# Patient Record
Sex: Female | Born: 1990
Health system: Southern US, Community
[De-identification: ages and names within clinical notes are randomized; demographics above are authoritative.]

## PROBLEM LIST (undated history)

## (undated) DIAGNOSIS — N809 Endometriosis, unspecified: Secondary | ICD-10-CM

## (undated) DIAGNOSIS — G43909 Migraine, unspecified, not intractable, without status migrainosus: Secondary | ICD-10-CM

---

## 2017-06-26 ENCOUNTER — Other Ambulatory Visit: Payer: Self-pay

## 2017-06-26 ENCOUNTER — Encounter (HOSPITAL_COMMUNITY): Payer: Self-pay | Admitting: Emergency Medicine

## 2017-06-26 ENCOUNTER — Emergency Department (HOSPITAL_COMMUNITY)
Admission: EM | Admit: 2017-06-26 | Discharge: 2017-06-26 | Disposition: A | Discharge status: Home / Self Care | Payer: Self-pay | Attending: Emergency Medicine | Admitting: Emergency Medicine

## 2017-06-26 DIAGNOSIS — K0889 Other specified disorders of teeth and supporting structures: Secondary | ICD-10-CM | POA: Insufficient documentation

## 2017-06-26 HISTORY — DX: Endometriosis, unspecified: N80.9

## 2017-06-26 MED ORDER — PENICILLIN V POTASSIUM 500 MG PO TABS
500.0000 mg | ORAL_TABLET | Freq: Once | ORAL | Status: AC
  Administered 2017-06-26: 500 mg via ORAL
  Filled 2017-06-26: qty 1

## 2017-06-26 MED ORDER — IBUPROFEN 200 MG PO TABS
600.0000 mg | ORAL_TABLET | Freq: Once | ORAL | Status: AC
  Administered 2017-06-26: 600 mg via ORAL
  Filled 2017-06-26: qty 3

## 2017-06-26 MED ORDER — HYDROCODONE-ACETAMINOPHEN 5-325 MG PO TABS: 1.0000 | ORAL_TABLET | Freq: Four times a day (QID) | ORAL | 0 refills | Status: DC | PRN

## 2017-06-26 MED ORDER — PENICILLIN V POTASSIUM 500 MG PO TABS: 500.0000 mg | ORAL_TABLET | Freq: Four times a day (QID) | ORAL | 0 refills | Status: AC

## 2017-06-26 MED ORDER — IBUPROFEN 600 MG PO TABS: 600.0000 mg | ORAL_TABLET | Freq: Four times a day (QID) | ORAL | 0 refills | Status: DC | PRN

## 2017-06-26 NOTE — ED Triage Notes (Signed)
Pt is c/o dental pain on the right side on top   Pt states she has Anbesol and OTC medication without relief  Pt states she has a wisdom tooth coming in and a chipped tooth on that side

## 2017-06-26 NOTE — ED Provider Notes (Signed)
[facility]-EMERGENCY DEPT Provider Note   CSN: [id number] Arrival date & time: 06/26/17  0458     History   Chief Complaint Chief Complaint  Patient presents with  . Dental Pain    HPI Alejandra Clark is a 26 y.o. female.  26 year old female presents to the emergency department for dental pain.  She reports intermittent dental pain since September which became more constant 1.5 weeks ago.  She is no longer having relief with over-the-counter Motrin.  Patient describes the pain as aching.  No modifying factors of pain.  She has not been able to follow-up with a dentist secondary to lack of insurance coverage.  She denies any inability to open her jaw.  No fevers or history of trauma.      Past Medical History:  Diagnosis Date  . Endometriosis     There are no active problems to display for this patient.   History reviewed. No pertinent surgical history.  OB History    No data available       Home Medications    Prior to Admission medications   Medication Sig Start Date End Date Taking? Authorizing Provider  HYDROcodone-acetaminophen (NORCO/VICODIN) 5-325 MG tablet Take 1 tablet every 6 (six) hours as needed by mouth for severe pain. 06/26/17   Antony MaduraHumes, Shayanna Thatch, PA-C  ibuprofen (ADVIL,MOTRIN) 600 MG tablet Take 1 tablet (600 mg total) every 6 (six) hours as needed by mouth. 06/26/17   Antony MaduraHumes, Shantil Vallejo, PA-C  penicillin v potassium (VEETID) 500 MG tablet Take 1 tablet (500 mg total) 4 (four) times daily for 7 days by mouth. 06/26/17 07/03/17  Antony MaduraHumes, Shephanie Romas, PA-C    Family History Family History  Problem Relation Age of Onset  . Hypertension Other   . Diabetes Other   . Kidney failure Other     Social History Social History   Tobacco Use  . Smoking status: Never Smoker  . Smokeless tobacco: Never Used  Substance Use Topics  . Alcohol use: Yes    Comment: social  . Drug use: No     Allergies   Tramadol   Review of Systems Review of  Systems Ten systems reviewed and are negative for acute change, except as noted in the HPI.    Physical Exam Updated Vital Signs BP (!) 127/91 (BP Location: Right Arm)   Pulse 64   Temp 98 F (36.7 C) (Oral)   Resp 16   Ht 5\' 3"  (1.6 m)   Wt 102.5 kg (226 lb)   LMP 06/23/2017 (Exact Date)   SpO2 100%   BMI 40.03 kg/m   Physical Exam  Constitutional: She is oriented to person, place, and time. She appears well-developed and well-nourished. No distress.  Nontoxic appearing and in NAD  HENT:  Head: Normocephalic and atraumatic.  Mouth/Throat: Oropharynx is clear and moist and mucous membranes are normal. No oral lesions. No trismus in the jaw. Abnormal dentition. Dental caries present. No dental abscesses.    No trismus.  Patient tolerating secretions without difficulty.  Eyes: Conjunctivae and EOM are normal. No scleral icterus.  Neck: Normal range of motion.  No meningismus  Pulmonary/Chest: Effort normal. No respiratory distress.  Respirations even and unlabored  Musculoskeletal: Normal range of motion.  Neurological: She is alert and oriented to person, place, and time.  Skin: Skin is warm and dry. No rash noted. She is not diaphoretic. No erythema. No pallor.  Psychiatric: She has a normal mood and affect. Her behavior is normal.  Nursing  note and vitals reviewed.    ED Treatments / Results  Labs (all labs ordered are listed, but only abnormal results are displayed) Labs Reviewed - No data to display  EKG  EKG Interpretation None       Radiology No results found.  Procedures Procedures (including critical care time)  Medications Ordered in ED Medications  ibuprofen (ADVIL,MOTRIN) tablet 600 mg (not administered)  penicillin v potassium (VEETID) tablet 500 mg (not administered)     Initial Impression / Assessment and Plan / ED Course  I have reviewed the triage vital signs and the nursing notes.  Pertinent labs & imaging results that were available  during my care of the patient were reviewed by me and considered in my medical decision making (see chart for details).     Patient with toothache.  No gross abscess.  Exam unconcerning for Ludwig's angina or spread of infection.  Will treat with penicillin and pain medicine.  Urged patient to follow-up with dentist.  Referral and return precautions provided. Patient discharged in stable condition with no unaddressed concerns.   Final Clinical Impressions(s) / ED Diagnoses   Final diagnoses:  Dentalgia    ED Discharge Orders        Ordered    penicillin v potassium (VEETID) 500 MG tablet  4 times daily     06/26/17 0536    HYDROcodone-acetaminophen (NORCO/VICODIN) 5-325 MG tablet  Every 6 hours PRN     06/26/17 0536    ibuprofen (ADVIL,MOTRIN) 600 MG tablet  Every 6 hours PRN     06/26/17 0536       Antony MaduraHumes, Jael Kostick, PA-C 06/26/17 0542    Palumbo, April, MD 06/26/17 [phone]

## 2017-06-26 NOTE — Discharge Instructions (Signed)
Follow-up with a dentist as soon as you are able.  You will likely also require follow-up with an oral surgeon.  Take penicillin as prescribed until finished.

## 2017-06-27 ENCOUNTER — Emergency Department (HOSPITAL_BASED_OUTPATIENT_CLINIC_OR_DEPARTMENT_OTHER)
Admission: EM | Admit: 2017-06-27 | Discharge: 2017-06-27 | Disposition: A | Discharge status: Home / Self Care | Payer: Self-pay | Attending: Emergency Medicine | Admitting: Emergency Medicine

## 2017-06-27 ENCOUNTER — Encounter (HOSPITAL_BASED_OUTPATIENT_CLINIC_OR_DEPARTMENT_OTHER): Payer: Self-pay | Admitting: Emergency Medicine

## 2017-06-27 ENCOUNTER — Other Ambulatory Visit: Payer: Self-pay

## 2017-06-27 DIAGNOSIS — N390 Urinary tract infection, site not specified: Secondary | ICD-10-CM | POA: Insufficient documentation

## 2017-06-27 DIAGNOSIS — R102 Pelvic and perineal pain: Secondary | ICD-10-CM | POA: Insufficient documentation

## 2017-06-27 DIAGNOSIS — R110 Nausea: Secondary | ICD-10-CM | POA: Insufficient documentation

## 2017-06-27 DIAGNOSIS — M545 Low back pain: Secondary | ICD-10-CM | POA: Insufficient documentation

## 2017-06-27 LAB — WET PREP, GENITAL
Sperm: NONE SEEN
TRICH WET PREP: NONE SEEN
YEAST WET PREP: NONE SEEN

## 2017-06-27 LAB — URINALYSIS, ROUTINE W REFLEX MICROSCOPIC
Bilirubin Urine: NEGATIVE
GLUCOSE, UA: NEGATIVE mg/dL
Ketones, ur: NEGATIVE mg/dL
Nitrite: NEGATIVE
Protein, ur: NEGATIVE mg/dL
SPECIFIC GRAVITY, URINE: 1.02 (ref 1.005–1.030)
pH: 6 (ref 5.0–8.0)

## 2017-06-27 LAB — URINALYSIS, MICROSCOPIC (REFLEX)

## 2017-06-27 LAB — PREGNANCY, URINE: Preg Test, Ur: NEGATIVE

## 2017-06-27 MED ORDER — CEPHALEXIN 250 MG PO CAPS
1000.0000 mg | ORAL_CAPSULE | Freq: Once | ORAL | Status: AC
  Administered 2017-06-27: 1000 mg via ORAL
  Filled 2017-06-27: qty 4

## 2017-06-27 MED ORDER — IBUPROFEN 800 MG PO TABS
800.0000 mg | ORAL_TABLET | Freq: Once | ORAL | Status: AC
  Administered 2017-06-27: 800 mg via ORAL
  Filled 2017-06-27: qty 1

## 2017-06-27 MED ORDER — IBUPROFEN 800 MG PO TABS: 800.0000 mg | ORAL_TABLET | Freq: Three times a day (TID) | ORAL | 0 refills | Status: DC

## 2017-06-27 MED ORDER — CEPHALEXIN 500 MG PO CAPS: 1000.0000 mg | ORAL_CAPSULE | Freq: Two times a day (BID) | ORAL | 0 refills | Status: DC

## 2017-06-27 MED ORDER — ACETAMINOPHEN 500 MG PO TABS: 1000.0000 mg | ORAL_TABLET | Freq: Four times a day (QID) | ORAL | 0 refills | Status: AC | PRN

## 2017-06-27 MED ORDER — ACETAMINOPHEN 500 MG PO TABS
1000.0000 mg | ORAL_TABLET | Freq: Once | ORAL | Status: AC
  Administered 2017-06-27: 1000 mg via ORAL
  Filled 2017-06-27: qty 2

## 2017-06-27 NOTE — ED Notes (Signed)
Pt given d/c instructions as per chart. Rx x 3. Verbalizes understanding. No questions. 

## 2017-06-27 NOTE — ED Notes (Signed)
Pt agitated that she has not received any pain medicine and that "no one has been in here to see me."

## 2017-06-27 NOTE — ED Triage Notes (Signed)
Pt c/o generalized abd pain, nausea x 2wks

## 2017-06-27 NOTE — ED Provider Notes (Signed)
[facility] EMERGENCY DEPARTMENT Provider Note   CSN: [id number] Arrival date & time: 06/27/17  1603     History   Chief Complaint Chief Complaint  Patient presents with  . Abdominal Pain    HPI Alejandra Clark is a 26 y.o. female.  HPI Patient reports she is has a history of endometriosis.  She reports that she gets pelvic pain and irregular bleeding.  However for the past 2 weeks she has been having intermittent spotting and bleeding.  Also the cramping has been ongoing with pain in her lower back.  She reports is atypical for the symptoms to go on for this duration.  Ports she is sexually active.  It does not suspect sexually transmitted disease.  No fever or vomiting.  Some nausea. Past Medical History:  Diagnosis Date  . Endometriosis     There are no active problems to display for this patient.   History reviewed. No pertinent surgical history.  OB History    No data available       Home Medications    Prior to Admission medications   Medication Sig Start Date End Date Taking? Authorizing Provider  acetaminophen (TYLENOL) 500 MG tablet Take 2 tablets (1,000 mg total) every 6 (six) hours as needed by mouth. 06/27/17   Arby BarrettePfeiffer, Preethi Scantlebury, MD  cephALEXin (KEFLEX) 500 MG capsule Take 2 capsules (1,000 mg total) 2 (two) times daily by mouth. 06/27/17   Arby BarrettePfeiffer, Mahi Zabriskie, MD  HYDROcodone-acetaminophen (NORCO/VICODIN) 5-325 MG tablet Take 1 tablet every 6 (six) hours as needed by mouth for severe pain. 06/26/17   Antony MaduraHumes, Kelly, PA-C  ibuprofen (ADVIL,MOTRIN) 600 MG tablet Take 1 tablet (600 mg total) every 6 (six) hours as needed by mouth. 06/26/17   Antony MaduraHumes, Kelly, PA-C  ibuprofen (ADVIL,MOTRIN) 800 MG tablet Take 1 tablet (800 mg total) 3 (three) times daily by mouth. 06/27/17   Arby BarrettePfeiffer, Aveleen Nevers, MD  penicillin v potassium (VEETID) 500 MG tablet Take 1 tablet (500 mg total) 4 (four) times daily for 7 days by mouth. 06/26/17 07/03/17  Antony MaduraHumes, Kelly, PA-C    Family  History Family History  Problem Relation Age of Onset  . Hypertension Other   . Diabetes Other   . Kidney failure Other     Social History Social History   Tobacco Use  . Smoking status: Never Smoker  . Smokeless tobacco: Never Used  Substance Use Topics  . Alcohol use: Yes    Comment: social  . Drug use: No     Allergies   Tramadol   Review of Systems Review of Systems 10 Systems reviewed and are negative for acute change except as noted in the HPI.   Physical Exam Updated Vital Signs BP (!) 137/91 (BP Location: Right Arm)   Pulse 86   Temp 98.3 F (36.8 C) (Oral)   Resp 16   LMP 06/23/2017 (Exact Date)   SpO2 99%   Physical Exam  Constitutional: She is oriented to person, place, and time. She appears well-developed and well-nourished. No distress.  HENT:  Head: Normocephalic and atraumatic.  Eyes: Conjunctivae are normal.  Neck: Neck supple.  Cardiovascular: Normal rate and regular rhythm.  No murmur heard. Pulmonary/Chest: Effort normal and breath sounds normal. No respiratory distress.  Abdominal: Soft. She exhibits no distension. There is tenderness.  Patient endorses diffuse lower abdominal pain to palpation.  No guarding.  Genitourinary:  Genitourinary Comments: Normal external female genitalia.  Speculum examination small to moderate volume of menstrual blood in the vault.  No clot.  Cervix is closed without clots or discharge.  Patient endorses generalized tenderness to palpation.  She has tenderness over the cervix uterus and left adnexa.  She reports that this is not an atypical amount of pain on a pelvic exam for her.  Musculoskeletal: She exhibits no edema.  Neurological: She is alert and oriented to person, place, and time. No cranial nerve deficit. She exhibits normal muscle tone. Coordination normal.  Skin: Skin is warm and dry.  Psychiatric: She has a normal mood and affect.  Nursing note and vitals reviewed.    ED Treatments / Results    Labs (all labs ordered are listed, but only abnormal results are displayed) Labs Reviewed  WET PREP, GENITAL - Abnormal; Notable for the following components:      Result Value   Clue Cells Wet Prep HPF POC PRESENT (*)    WBC, Wet Prep HPF POC MANY (*)    All other components within normal limits  URINALYSIS, ROUTINE W REFLEX MICROSCOPIC - Abnormal; Notable for the following components:   APPearance CLOUDY (*)    Hgb urine dipstick LARGE (*)    Leukocytes, UA LARGE (*)    All other components within normal limits  URINALYSIS, MICROSCOPIC (REFLEX) - Abnormal; Notable for the following components:   Bacteria, UA MANY (*)    Squamous Epithelial / LPF 6-30 (*)    All other components within normal limits  URINE CULTURE  PREGNANCY, URINE  GC/CHLAMYDIA PROBE AMP [facility]    EKG  EKG Interpretation None       Radiology No results found.  Procedures Procedures (including critical care time)  Medications Ordered in ED Medications  ibuprofen (ADVIL,MOTRIN) tablet 800 mg (800 mg Oral Refused 06/27/17 2100)  acetaminophen (TYLENOL) tablet 1,000 mg (1,000 mg Oral Refused 06/27/17 2101)  cephALEXin (KEFLEX) capsule 1,000 mg (1,000 mg Oral Given 06/27/17 2100)     Initial Impression / Assessment and Plan / ED Course  I have reviewed the triage vital signs and the nursing notes.  Pertinent labs & imaging results that were available during my care of the patient were reviewed by me and considered in my medical decision making (see chart for details).     Final Clinical Impressions(s) / ED Diagnoses   Final diagnoses:  Nausea  Pelvic pain in female  Lower urinary tract infectious disease   Patient's urinalysis was grossly positive for UTI.  With patient having increasing pelvic and low back pain I will opt to treat with Keflex.  She reports endometriosis and irregular menstrual bleeding at baseline.  At this time we will not opt for empiric treatment of STD.   Cultures will be pending.  Patient counseled to use ibuprofen and Tylenol for pain.  She was given a Vicodin prescription for dental pain in the past day.  She may use this but not Tylenol together as advised. ED Discharge Orders        Ordered    cephALEXin (KEFLEX) 500 MG capsule  2 times daily     06/27/17 2123    ibuprofen (ADVIL,MOTRIN) 800 MG tablet  3 times daily     06/27/17 2123    acetaminophen (TYLENOL) 500 MG tablet  Every 6 hours PRN     06/27/17 2123       Arby BarrettePfeiffer, Natale Barba, MD 06/27/17 2133

## 2017-06-27 NOTE — ED Notes (Signed)
Pelvic Cart set up in room, Specimens not collected yet.

## 2017-06-29 LAB — URINE CULTURE

## 2017-06-29 LAB — GC/CHLAMYDIA PROBE AMP (~~LOC~~) NOT AT ARMC
CHLAMYDIA, DNA PROBE: NEGATIVE
NEISSERIA GONORRHEA: NEGATIVE

## 2017-08-27 ENCOUNTER — Other Ambulatory Visit: Payer: Self-pay

## 2017-08-27 ENCOUNTER — Emergency Department (HOSPITAL_BASED_OUTPATIENT_CLINIC_OR_DEPARTMENT_OTHER)
Admission: EM | Admit: 2017-08-27 | Discharge: 2017-08-27 | Disposition: A | Discharge status: Home / Self Care | Payer: Medicaid Other | Attending: Emergency Medicine | Admitting: Emergency Medicine

## 2017-08-27 ENCOUNTER — Emergency Department (HOSPITAL_BASED_OUTPATIENT_CLINIC_OR_DEPARTMENT_OTHER): Payer: Medicaid Other

## 2017-08-27 ENCOUNTER — Encounter (HOSPITAL_BASED_OUTPATIENT_CLINIC_OR_DEPARTMENT_OTHER): Payer: Self-pay | Admitting: *Deleted

## 2017-08-27 DIAGNOSIS — B9689 Other specified bacterial agents as the cause of diseases classified elsewhere: Secondary | ICD-10-CM | POA: Diagnosis not present

## 2017-08-27 DIAGNOSIS — N760 Acute vaginitis: Secondary | ICD-10-CM | POA: Diagnosis not present

## 2017-08-27 DIAGNOSIS — Z79899 Other long term (current) drug therapy: Secondary | ICD-10-CM | POA: Insufficient documentation

## 2017-08-27 DIAGNOSIS — R102 Pelvic and perineal pain: Secondary | ICD-10-CM

## 2017-08-27 DIAGNOSIS — D649 Anemia, unspecified: Secondary | ICD-10-CM

## 2017-08-27 LAB — URINALYSIS, ROUTINE W REFLEX MICROSCOPIC
BILIRUBIN URINE: NEGATIVE
GLUCOSE, UA: NEGATIVE mg/dL
Hgb urine dipstick: NEGATIVE
KETONES UR: NEGATIVE mg/dL
NITRITE: NEGATIVE
PROTEIN: NEGATIVE mg/dL
SPECIFIC GRAVITY, URINE: 1.025 (ref 1.005–1.030)
pH: 6 (ref 5.0–8.0)

## 2017-08-27 LAB — COMPREHENSIVE METABOLIC PANEL
ALBUMIN: 3.9 g/dL (ref 3.5–5.0)
ALT: 17 U/L (ref 14–54)
ANION GAP: 9 (ref 5–15)
AST: 18 U/L (ref 15–41)
Alkaline Phosphatase: 50 U/L (ref 38–126)
BUN: 8 mg/dL (ref 6–20)
CO2: 22 mmol/L (ref 22–32)
Calcium: 9 mg/dL (ref 8.9–10.3)
Chloride: 103 mmol/L (ref 101–111)
Creatinine, Ser: 0.78 mg/dL (ref 0.44–1.00)
GFR calc non Af Amer: >60 mL/min (ref >60)
GLUCOSE: 92 mg/dL (ref 65–99)
POTASSIUM: 4.2 mmol/L (ref 3.5–5.1)
SODIUM: 134 mmol/L — AB (ref 135–145)
Total Bilirubin: 0.3 mg/dL (ref 0.3–1.2)
Total Protein: 8.2 g/dL — ABNORMAL HIGH (ref 6.5–8.1)

## 2017-08-27 LAB — CBC WITH DIFFERENTIAL/PLATELET
BASOS PCT: 0 %
Basophils Absolute: 0 10*3/uL (ref 0.0–0.1)
Eosinophils Absolute: 0.1 10*3/uL (ref 0.0–0.7)
Eosinophils Relative: 1 %
HEMATOCRIT: 33.9 % — AB (ref 36.0–46.0)
HEMOGLOBIN: 10.7 g/dL — AB (ref 12.0–15.0)
LYMPHS ABS: 2.5 10*3/uL (ref 0.7–4.0)
Lymphocytes Relative: 27 %
MCH: 23.8 pg — AB (ref 26.0–34.0)
MCHC: 31.6 g/dL (ref 30.0–36.0)
MCV: 75.3 fL — AB (ref 78.0–100.0)
MONOS PCT: 10 %
Monocytes Absolute: 0.9 10*3/uL (ref 0.1–1.0)
NEUTROS ABS: 5.5 10*3/uL (ref 1.7–7.7)
NEUTROS PCT: 62 %
Platelets: 454 10*3/uL — ABNORMAL HIGH (ref 150–400)
RBC: 4.5 MIL/uL (ref 3.87–5.11)
RDW: 17.7 % — ABNORMAL HIGH (ref 11.5–15.5)
WBC: 9 10*3/uL (ref 4.0–10.5)

## 2017-08-27 LAB — URINALYSIS, MICROSCOPIC (REFLEX)

## 2017-08-27 LAB — WET PREP, GENITAL
SPERM: NONE SEEN
Trich, Wet Prep: NONE SEEN
Yeast Wet Prep HPF POC: NONE SEEN

## 2017-08-27 LAB — PREGNANCY, URINE: Preg Test, Ur: NEGATIVE

## 2017-08-27 MED ORDER — NAPROXEN 500 MG PO TABS: 500.0000 mg | ORAL_TABLET | Freq: Two times a day (BID) | ORAL | 0 refills | Status: DC

## 2017-08-27 MED ORDER — KETOROLAC TROMETHAMINE 15 MG/ML IJ SOLN
15.0000 mg | Freq: Once | INTRAMUSCULAR | Status: AC
  Administered 2017-08-27: 15 mg via INTRAVENOUS
  Filled 2017-08-27: qty 1

## 2017-08-27 MED ORDER — ACETAMINOPHEN 500 MG PO TABS
1000.0000 mg | ORAL_TABLET | Freq: Once | ORAL | Status: AC
  Administered 2017-08-27: 1000 mg via ORAL
  Filled 2017-08-27: qty 2

## 2017-08-27 MED ORDER — TRAMADOL HCL 50 MG PO TABS
50.0000 mg | ORAL_TABLET | Freq: Once | ORAL | Status: AC
  Administered 2017-08-27: 50 mg via ORAL
  Filled 2017-08-27 (×2): qty 1

## 2017-08-27 MED ORDER — FERROUS SULFATE 325 (65 FE) MG PO TABS: 325.0000 mg | ORAL_TABLET | Freq: Every day | ORAL | 0 refills | Status: AC

## 2017-08-27 MED ORDER — ONDANSETRON HCL 4 MG/2ML IJ SOLN
4.0000 mg | Freq: Once | INTRAMUSCULAR | Status: AC
  Administered 2017-08-27: 4 mg via INTRAVENOUS
  Filled 2017-08-27: qty 2

## 2017-08-27 MED ORDER — SODIUM CHLORIDE 0.9 % IV BOLUS (SEPSIS)
1000.0000 mL | Freq: Once | INTRAVENOUS | Status: AC
  Administered 2017-08-27: 1000 mL via INTRAVENOUS

## 2017-08-27 MED ORDER — METRONIDAZOLE 500 MG PO TABS: 500.0000 mg | ORAL_TABLET | Freq: Two times a day (BID) | ORAL | 0 refills | Status: DC

## 2017-08-27 MED ORDER — TRAMADOL HCL 50 MG PO TABS: 50.0000 mg | ORAL_TABLET | Freq: Four times a day (QID) | ORAL | 0 refills | Status: DC | PRN

## 2017-08-27 MED FILL — metroNIDAZOLE 500 MG TABS: 500 | 7 days supply | Qty: 14 | Fill #0

## 2017-08-27 MED FILL — NAPROXEN 500 MG TABLET: 500 | 15 days supply | Qty: 30 | Fill #0

## 2017-08-27 MED FILL — FERROUS SULFATE 325 MG TAB: 325 (65 FE) | 100 days supply | Qty: 100 | Fill #0

## 2017-08-27 MED FILL — traMADol HCL 50 MG TABS: 50 | 3 days supply | Qty: 15 | Fill #0

## 2017-08-27 NOTE — ED Triage Notes (Signed)
Pt reports her usual endometriosis pelvic pain x Sunday, worsening over the last 2 days. Denies any vag bleeding or d/c, denies n/v/d or fevers.

## 2017-08-27 NOTE — ED Provider Notes (Signed)
[facility] EMERGENCY DEPARTMENT Provider Note   CSN: [id number] Arrival date & time: 08/27/17  1055     History   Chief Complaint Chief Complaint  Patient presents with  . Pelvic Pain    HPI Alejandra Clark is a 27 y.o. female with a hx of endometriosis and anemia who presents to the ED complaining of pelvic pain for the past 5 days. Patient states she is having diffuse pressure pelvic pain with intermittent sharp pain to the left side of the pelvis.  Rates the pain a 7/10 in severity, has tried tylenol, ibuprofen, and naproxen all without relief- has not had any medicines today. Pain is somewhat better with heating pad. Pain does not have any specific worsening factors. States pain is similar to previous pain with endometriosis, however typically sharp pains are on the R. LMP ended 01/04, unsure of exact start date- states did have larger blood clots with this previous menses, states she typically has heavy periods. Reports baseline unchanged vaginal discharge. Denies vaginal bleeding, dysuria, urgency, frequency, vomiting, blood in stool, fever, or chills. Currently looking for new Ob/gyn in the area. Currently sexually active, no protection, states she is having dyspareunia which is baseline for her.   HPI  Past Medical History:  Diagnosis Date  . Endometriosis     There are no active problems to display for this patient.   History reviewed. No pertinent surgical history.  OB History    No data available       Home Medications    Prior to Admission medications   Medication Sig Start Date End Date Taking? Authorizing Provider  acetaminophen (TYLENOL) 500 MG tablet Take 2 tablets (1,000 mg total) every 6 (six) hours as needed by mouth. 06/27/17   Arby Barrette, MD  cephALEXin (KEFLEX) 500 MG capsule Take 2 capsules (1,000 mg total) 2 (two) times daily by mouth. 06/27/17   Arby Barrette, MD  HYDROcodone-acetaminophen (NORCO/VICODIN) 5-325 MG tablet Take 1  tablet every 6 (six) hours as needed by mouth for severe pain. 06/26/17   Antony Madura, PA-C  ibuprofen (ADVIL,MOTRIN) 600 MG tablet Take 1 tablet (600 mg total) every 6 (six) hours as needed by mouth. 06/26/17   Antony Madura, PA-C  ibuprofen (ADVIL,MOTRIN) 800 MG tablet Take 1 tablet (800 mg total) 3 (three) times daily by mouth. 06/27/17   Arby Barrette, MD    Family History Family History  Problem Relation Age of Onset  . Hypertension Other   . Diabetes Other   . Kidney failure Other     Social History Social History   Tobacco Use  . Smoking status: Never Smoker  . Smokeless tobacco: Never Used  Substance Use Topics  . Alcohol use: Yes    Comment: social  . Drug use: No     Allergies   Penicillins and Tramadol   Review of Systems Review of Systems  Constitutional: Negative for chills and fever.  Respiratory: Negative for shortness of breath.   Cardiovascular: Negative for chest pain.  Gastrointestinal: Positive for nausea. Negative for blood in stool, constipation and diarrhea.  Genitourinary: Positive for dyspareunia (chronic unchanged), pelvic pain and vaginal discharge (chronic unchanged). Negative for decreased urine volume, difficulty urinating, dysuria, flank pain, hematuria and vaginal bleeding.  All other systems reviewed and are negative.    Physical Exam Updated Vital Signs BP 124/88 (BP Location: Right Arm)   Pulse 70   Temp 98.6 F (37 C) (Oral)   Resp 18   Ht 5\' 3"  (  1.6 m)   Wt 97.5 kg (215 lb)   LMP 08/11/2017   SpO2 100%   BMI 38.09 kg/m   Physical Exam  Constitutional: She appears well-developed and well-nourished.  Non-toxic appearance. No distress.  HENT:  Head: Normocephalic and atraumatic.  Eyes: Conjunctivae are normal. Right eye exhibits no discharge. Left eye exhibits no discharge.  Cardiovascular: Normal rate and regular rhythm.  No murmur heard. Pulmonary/Chest: Breath sounds normal. No respiratory distress. She has no  wheezes. She has no rales.  Abdominal: Soft. She exhibits no distension and no mass. There is tenderness (diffuse suprapbuic). There is no rigidity, no rebound, no guarding, no CVA tenderness and no tenderness at McBurney's point.  Genitourinary: Pelvic exam was performed with patient supine. There is no tenderness or lesion on the right labia. There is no tenderness or lesion on the left labia. Cervix exhibits discharge (white thick). Cervix exhibits no motion tenderness. Right adnexum displays tenderness. Right adnexum displays no mass. Left adnexum displays tenderness. Left adnexum displays no mass. Vaginal discharge (white, thick) found.  Genitourinary Comments: [name] present as chaperone  Neurological: She is alert.  Clear speech.   Skin: Skin is warm and dry. No rash noted.  Psychiatric: She has a normal mood and affect. Her behavior is normal.  Nursing note and vitals reviewed.  ED Treatments / Results  Labs Results for orders placed or performed during the hospital encounter of 08/27/17  Wet prep, genital  Result Value Ref Range   Yeast Wet Prep HPF POC NONE SEEN NONE SEEN   Trich, Wet Prep NONE SEEN NONE SEEN   Clue Cells Wet Prep HPF POC PRESENT (A) NONE SEEN   WBC, Wet Prep HPF POC MANY (A) NONE SEEN   Sperm NONE SEEN   Pregnancy, urine  Result Value Ref Range   Preg Test, Ur NEGATIVE NEGATIVE  Comprehensive metabolic panel  Result Value Ref Range   Sodium 134 (L) 135 - 145 mmol/L   Potassium 4.2 3.5 - 5.1 mmol/L   Chloride 103 101 - 111 mmol/L   CO2 22 22 - 32 mmol/L   Glucose, Bld 92 65 - 99 mg/dL   BUN 8 6 - 20 mg/dL   Creatinine, Ser 4.09 0.44 - 1.00 mg/dL   Calcium 9.0 8.9 - 81.1 mg/dL   Total Protein 8.2 (H) 6.5 - 8.1 g/dL   Albumin 3.9 3.5 - 5.0 g/dL   AST 18 15 - 41 U/L   ALT 17 14 - 54 U/L   Alkaline Phosphatase 50 38 - 126 U/L   Total Bilirubin 0.3 0.3 - 1.2 mg/dL   GFR calc non Af Amer >60 >60 mL/min   GFR calc Af Amer >60 >60 mL/min   Anion gap 9 5  - 15  CBC with Differential  Result Value Ref Range   WBC 9.0 4.0 - 10.5 K/uL   RBC 4.50 3.87 - 5.11 MIL/uL   Hemoglobin 10.7 (L) 12.0 - 15.0 g/dL   HCT 91.4 (L) 78.2 - 95.6 %   MCV 75.3 (L) 78.0 - 100.0 fL   MCH 23.8 (L) 26.0 - 34.0 pg   MCHC 31.6 30.0 - 36.0 g/dL   RDW 21.3 (H) 08.6 - 57.8 %   Platelets 454 (H) 150 - 400 K/uL   Neutrophils Relative % 62 %   Neutro Abs 5.5 1.7 - 7.7 K/uL   Lymphocytes Relative 27 %   Lymphs Abs 2.5 0.7 - 4.0 K/uL   Monocytes Relative 10 %  Monocytes Absolute 0.9 0.1 - 1.0 K/uL   Eosinophils Relative 1 %   Eosinophils Absolute 0.1 0.0 - 0.7 K/uL   Basophils Relative 0 %   Basophils Absolute 0.0 0.0 - 0.1 K/uL  Urinalysis, Routine w reflex microscopic  Result Value Ref Range   Color, Urine YELLOW YELLOW   APPearance CLOUDY (A) CLEAR   Specific Gravity, Urine 1.025 1.005 - 1.030   pH 6.0 5.0 - 8.0   Glucose, UA NEGATIVE NEGATIVE mg/dL   Hgb urine dipstick NEGATIVE NEGATIVE   Bilirubin Urine NEGATIVE NEGATIVE   Ketones, ur NEGATIVE NEGATIVE mg/dL   Protein, ur NEGATIVE NEGATIVE mg/dL   Nitrite NEGATIVE NEGATIVE   Leukocytes, UA LARGE (A) NEGATIVE  Urinalysis, Microscopic (reflex)  Result Value Ref Range   RBC / HPF 0-5 0 - 5 RBC/hpf   WBC, UA 6-30 0 - 5 WBC/hpf   Bacteria, UA RARE (A) NONE SEEN   Squamous Epithelial / LPF 6-30 (A) NONE SEEN   Mucus PRESENT    EKG  EKG Interpretation None      Radiology US Abdominal Pelvic Art/vent Flow Doppler  Result Date: 08/27/2017 CLINICAL DATA:  Pelvic pain.  History of endometriosis. EXAM: TRANSABDOMINAL AND TRANSVAGINAL ULTRASOUND OF PELVIS DOPPLER ULTRASOUND OF OVARIES TECHNIQUE: Both transabdominal and transvaginal ultrasound examinations of the pelvis were performed. Transabdominal technique was performed for global imaging of the pelvis including uterus, ovaries, adnexal regions, and pelvic cul-de-sac. It was necessary to proceed with endovaginal exam following the transabdominal exam to  visualize the uterus and ovaries. Color and duplex Doppler ultrasound was utilized to evaluate blood flow to the ovaries. COMPARISON:  No prior. FINDINGS: Uterus Measurements: 8.1 x 3.4 x 4.7 cm. No fibroids or other mass visualized. Endometrium Thickness: 10.7 mm.  No focal abnormality visualized. Right ovary Measurements: 4.1 x 2.2 x 2.8 cm. Tiny 1.3 cm complex hypoechoic focus in the ovary most likely resolving corpus luteal cyst. Follow-up exam to demonstrate resolution can be obtained. Normal appearance/no adnexal mass. Left ovary Measurements: 3.3 x 1.9 x 3.0 cm. Normal appearance/no adnexal mass. Pulsed Doppler evaluation of both ovaries demonstrates normal low-resistance arterial and venous waveforms. Other findings Small amount of free pelvic fluid. IMPRESSION: 1. Tiny 1.3 cm complex hypoechoic focus in the right ovary most likely resolving corpus luteal cyst. 2. Small amount of free pelvic fluid. Exam is otherwise unremarkable. No evidence of torsion. Electronically Signed   By: Maisie Fus  Register   On: 08/27/2017 14:42   US Pelvic Complete With Transvaginal  Result Date: 08/27/2017 CLINICAL DATA:  Pelvic pain.  History of endometriosis. EXAM: TRANSABDOMINAL AND TRANSVAGINAL ULTRASOUND OF PELVIS DOPPLER ULTRASOUND OF OVARIES TECHNIQUE: Both transabdominal and transvaginal ultrasound examinations of the pelvis were performed. Transabdominal technique was performed for global imaging of the pelvis including uterus, ovaries, adnexal regions, and pelvic cul-de-sac. It was necessary to proceed with endovaginal exam following the transabdominal exam to visualize the uterus and ovaries. Color and duplex Doppler ultrasound was utilized to evaluate blood flow to the ovaries. COMPARISON:  No prior. FINDINGS: Uterus Measurements: 8.1 x 3.4 x 4.7 cm. No fibroids or other mass visualized. Endometrium Thickness: 10.7 mm.  No focal abnormality visualized. Right ovary Measurements: 4.1 x 2.2 x 2.8 cm. Tiny 1.3 cm  complex hypoechoic focus in the ovary most likely resolving corpus luteal cyst. Follow-up exam to demonstrate resolution can be obtained. Normal appearance/no adnexal mass. Left ovary Measurements: 3.3 x 1.9 x 3.0 cm. Normal appearance/no adnexal mass. Pulsed Doppler evaluation of both ovaries  demonstrates normal low-resistance arterial and venous waveforms. Other findings Small amount of free pelvic fluid. IMPRESSION: 1. Tiny 1.3 cm complex hypoechoic focus in the right ovary most likely resolving corpus luteal cyst. 2. Small amount of free pelvic fluid. Exam is otherwise unremarkable. No evidence of torsion. Electronically Signed   By: Maisie Fushomas  Register   On: 08/27/2017 14:42    Procedures Procedures (including critical care time)  Medications Ordered in ED Medications  traMADol (ULTRAM) tablet 50 mg (50 mg Oral Not Given 08/27/17 1540)  ondansetron (ZOFRAN) injection 4 mg (4 mg Intravenous Given 08/27/17 1339)  sodium chloride 0.9 % bolus 1,000 mL (0 mLs Intravenous Stopped 08/27/17 1540)  ketorolac (TORADOL) 15 MG/ML injection 15 mg (15 mg Intravenous Given 08/27/17 1340)  acetaminophen (TYLENOL) tablet 1,000 mg (1,000 mg Oral Given 08/27/17 1549)    Initial Impression / Assessment and Plan / ED Course  I have reviewed the triage vital signs and the nursing notes.  Pertinent labs & imaging results that were available during my care of the patient were reviewed by me and considered in my medical decision making (see chart for details).  Patient presents with complaint of pelvic pain.  She is nontoxic-appearing, in no apparent distress, with vitals WNL. Patient with diffuse suprapubic tenderness to palpation, otherwise abdomen is nontender. Will evaluate with pelvic exam, labs, and pelvic US. Pelvic exam with bilateral adnexal tenderness, no masses noted, there is a moderate amount of white thick discharge in cervical/vaginal area. No CMT- wet prep and gonorrhea/chlamydia pending. Will treat pain  with Toradol and nausea with Zofran. Will additionally administer fluids.   Labs with findings consistent with BV and anemia. Patient has a hx of BV as well as anemia. Patient has a hgb of 10.7, she states she has a hx of anemia, denies lightheadedness, dizziness, chest pain, or dyspnea, suspect this is baseline and related to patients heavy menses, no labs in system for chart review. Of note no leukocytosis, no significant electrolyte abnormality, no abnormality with kidney/liver function, no findings consistent with UTI or pyelonephritis on UA.  Urine pregnancy negative. US grossly unremarkable, negative for torsion, there is a tiny 1.3 cm complex hypoechoic focus in the right ovary most likely resolving corpus luteal cyst.   15:15: Patient's pain somewhat improved, remains present.  States that tramadol typically helps when NSAIDs are not fully resolving pain.  Allergy to Tramadol in patient's chart reviewed, patient states that if she has excessive this will make her feel anxious, no dyspnea, no angioedema, no rash. Will give dose of tramadol. Patient is tolerating PO without difficulty.   Patient is nontoxic, nonseptic appearing, in no apparent distress.  Patient's pain and other symptoms adequately managed in emergency department.  Fluid bolus given.  Labs, imaging and vitals reviewed.  Patient does not meet the SIRS or Sepsis criteria.  On repeat exam patient does not have a surgical abdomen and there are no peritoneal signs.  No indication of appendicitis, bowel obstruction, bowel perforation, cholecystitis, diverticulitis, PID, ectopic pregnancy, tuboovarian abscess, or ovarian torsion. She is tolerating PO. Will discharge home with Naproxen and Tramadol for pain, [place] quer[place]. Will start patient on ferrous sulfate for anemia. Will treat BV with Metronidazole. I discussed results, treatment plan, need for PCP and obgyn follow-up, and return precautions  with the patient. Provided opportunity for questions, patient confirmed understanding and is in agreement with plan.   Vitals:   08/27/17 1323 08/27/17 1556  BP: 124/61 132/73  Pulse: 87 76  Resp: 18 18  Temp:    SpO2: 100% 100%    Final Clinical Impressions(s) / ED Diagnoses   Final diagnoses:  Pelvic pain  Anemia, unspecified type  Bacterial vaginitis    ED Discharge Orders        Ordered    naproxen (NAPROSYN) 500 MG tablet  2 times daily     08/27/17 1626    traMADol (ULTRAM) 50 MG tablet  Every 6 hours PRN     08/27/17 1626    ferrous sulfate 325 (65 FE) MG tablet  Daily     08/27/17 1626    metroNIDAZOLE (FLAGYL) 500 MG tablet  2 times daily     08/27/17 [address], [place] R, PA-C 08/27/17 1651    Tilden Fossa, MD 08/28/17 [phone]

## 2017-08-27 NOTE — ED Notes (Signed)
Patient transported to Ultrasound 

## 2017-08-27 NOTE — Discharge Instructions (Addendum)
You were seen in the emergency department today for pelvic pain related to endometriosis. Your labs did not reveal any significant electrolyte abnormalities, problems with your kidney function, or problems with your liver function.  Your hemoglobin is low today at 10.7.  The ultrasound showed a very small cyst that is likely benign and a common finding. Your pelvic exam revealed findings consistent with bacterial vaginosis.   We have prescribed you multiple medications today to treat your symptoms:  - Naproxen -this is a nonsteroidal anti-inflammatory to treat pain.  May take this once every 12 hours as needed.  Be sure to take this with food as it can cause stomach upset and at worst stomach bleeding to take other NSAIDs (Motrin, Advil, Aleve) with this medicine as it will further irritate your stomach.  You may supplement with Tylenol. - Tramadol-is a stronger pain medication, take this for more severe pain.  You may take this once every 6 hours as needed.  Do not drive or operate heavy machinery when taking this medicine. - Ferrous Sulfate-this is an iron supplement, take this daily, to help with anemia. - Metronidazole-is an antibiotic used to treat her bacterial vaginosis.  This once in the morning and once in the evening.  Do not drink alcohol when taking this antibiotic as it can have severe side effects. Please take all of your antibiotics until finished. You may develop abdominal discomfort or diarrhea from the antibiotic.  You may help offset this with probiotics which you can buy at the store (ask your pharmacist if unable to find) or get probiotics in the form of eating yogurt. Do not eat or take the probiotics until 2 hours after your antibiotic. If you are unable to tolerate these side effects follow-up with your primary care provider or return to the emergency department.   If you begin to experience any blistering, rashes, swelling, or difficulty breathing seek medical care for evaluation of  potentially more serious side effects.   Please be aware that this medication may interact with other medications you are taking, please be sure to discuss your medication list with your pharmacist. If you are taking birth control the antibiotic will deactivate your birth control for 2 weeks.   Follow-up with the primary care provider in the OB/GYN providers provided your discharge instructions.  Call today or tomorrow in order to make an appointment for sometime next week for reevaluation and further management.  Return to the emergency department for any new or worsening symptoms including but not limited to worsening pain, inability to keep down fluids, fever, or any other concerns.

## 2017-08-28 LAB — GC/CHLAMYDIA PROBE AMP (~~LOC~~) NOT AT ARMC
Chlamydia: NEGATIVE
NEISSERIA GONORRHEA: NEGATIVE

## 2019-03-19 IMAGING — US US PELVIS COMPLETE TRANSABD/TRANSVAG
1 series · 13 of 25 positions shown · non-contrast
Comparison: No prior.

CLINICAL DATA: Pelvic pain.  History of endometriosis.

EXAM:
TRANSABDOMINAL AND TRANSVAGINAL ULTRASOUND OF PELVIS
DOPPLER ULTRASOUND OF OVARIES
TECHNIQUE: Both transabdominal and transvaginal ultrasound examinations of the
pelvis were performed. Transabdominal technique was performed for
global imaging of the pelvis including uterus, ovaries, adnexal
regions, and pelvic cul-de-sac.
It was necessary to proceed with endovaginal exam following the
transabdominal exam to visualize the uterus and ovaries. Color and
duplex Doppler ultrasound was utilized to evaluate blood flow to the
ovaries.

[Series 1: us pelvis complete transabd/transvag · 0.22mm/px · 13 of 98 slices shown]
[im 1/98]
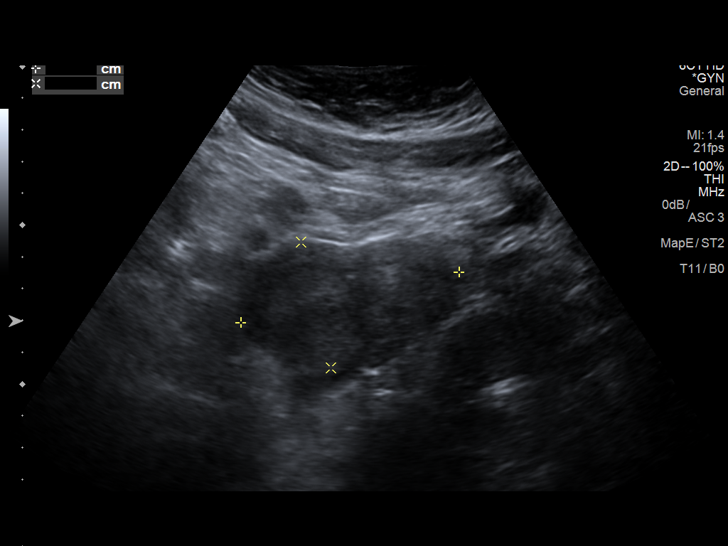
[im 9/98]
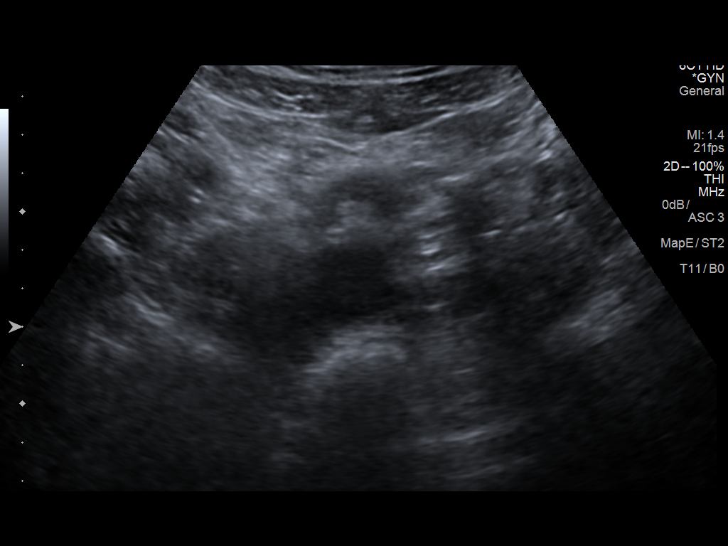
[im 17/98]
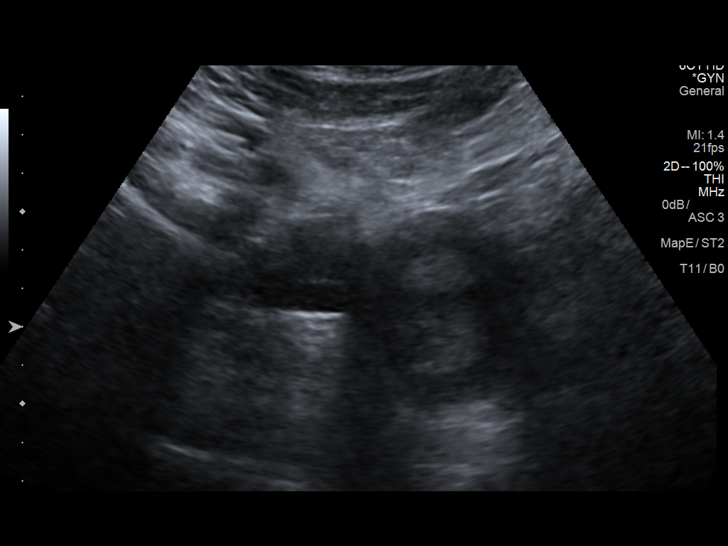
[im 25/98]
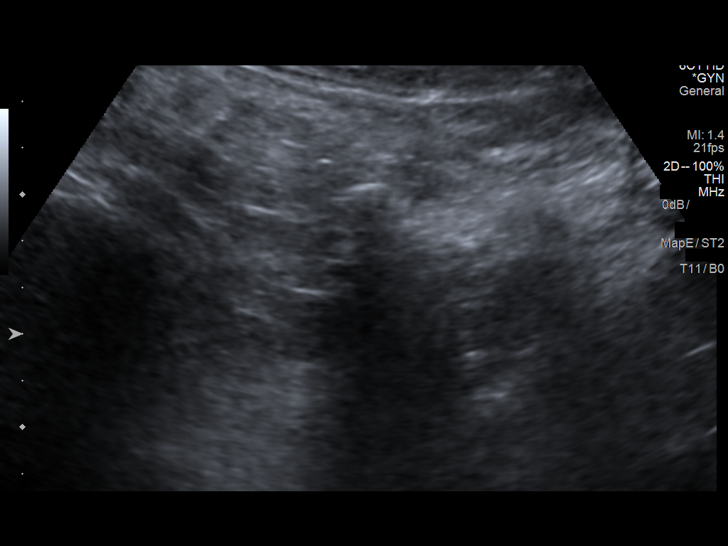
[im 33/98]
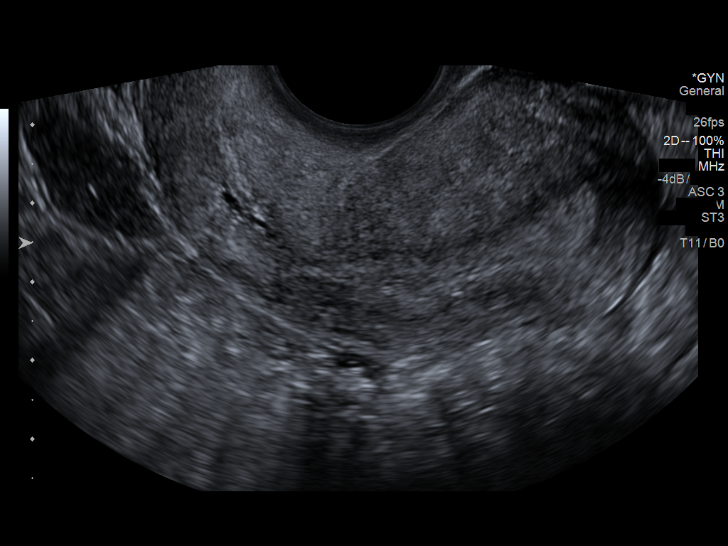
[im 41/98]
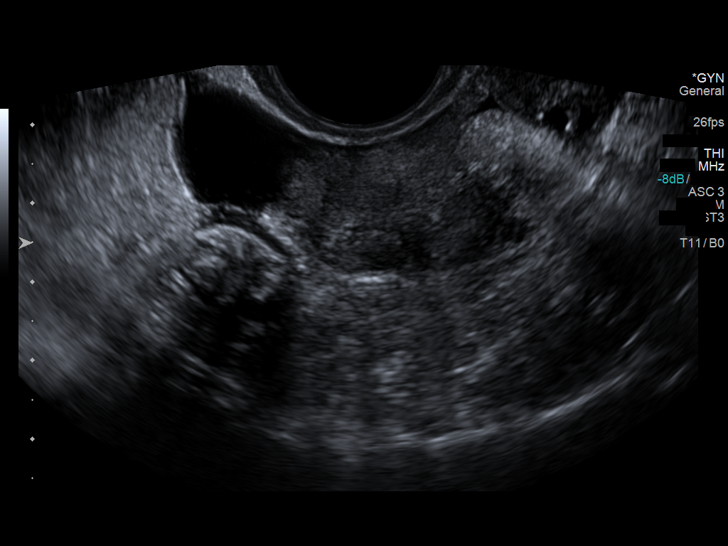
[im 49/98]
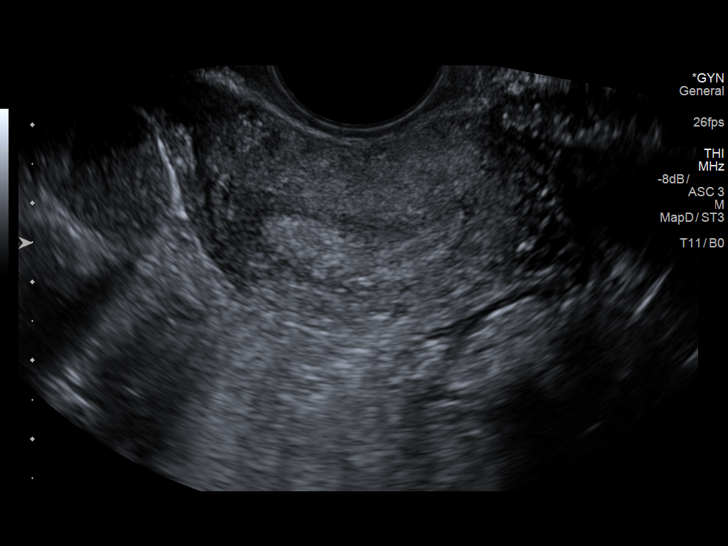
[im 57/98]
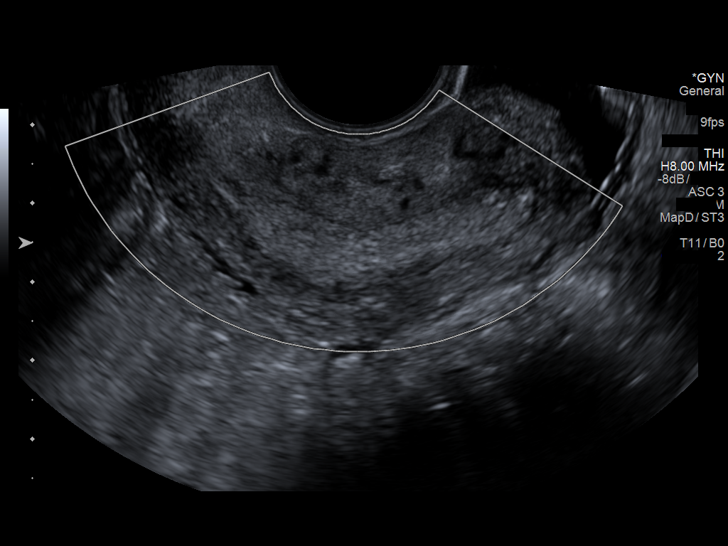
[im 65/98]
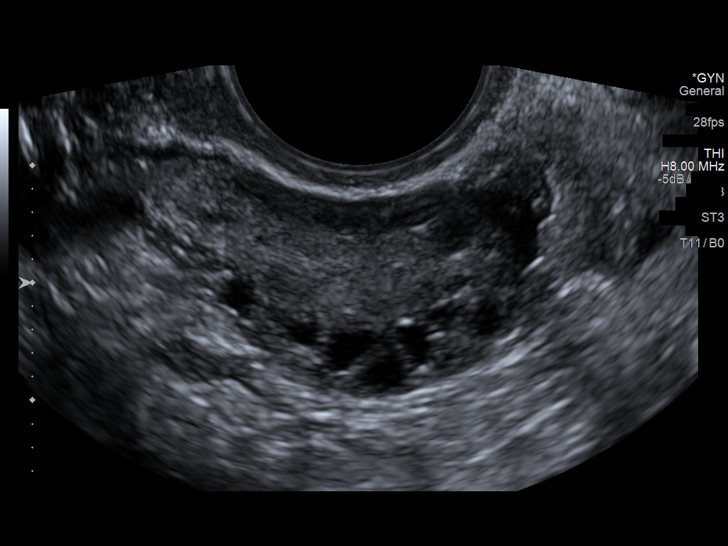
[im 73/98]
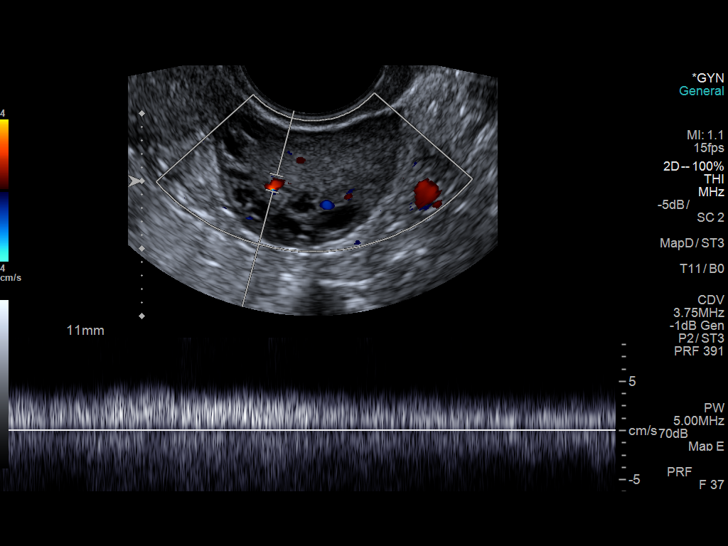
[im 81/98]
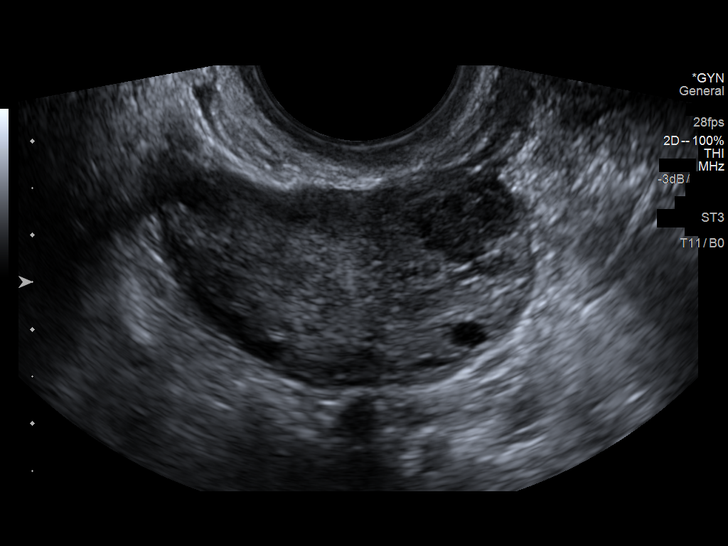
[im 89/98]
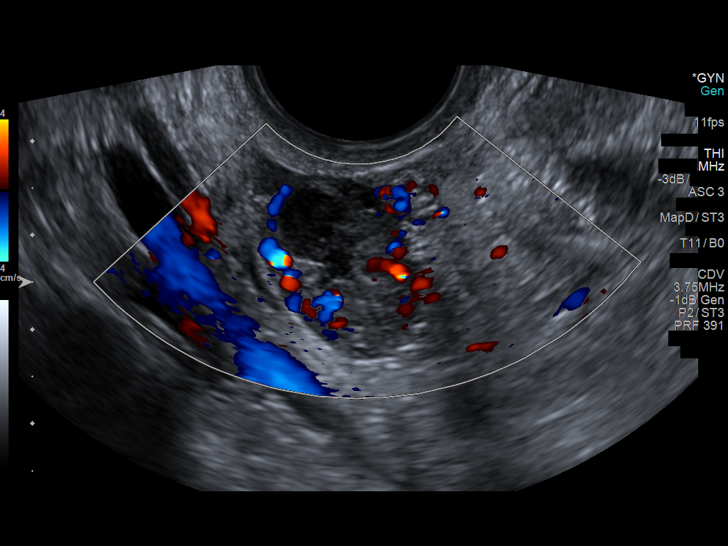
[im 98/98]
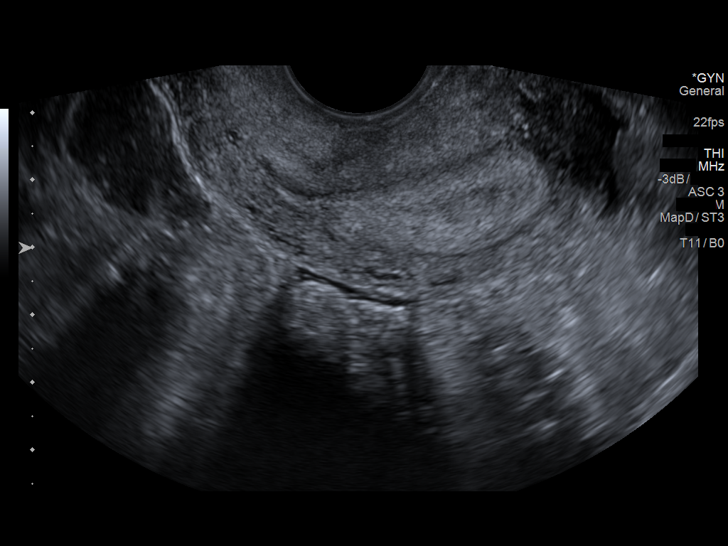

[13 of 25 positions shown; findings below may reference images not displayed]

FINDINGS: Uterus

Measurements: 8.1 x 3.4 x 4.7 cm. No fibroids or other mass
visualized.

Endometrium

Thickness: 10.7 mm.  No focal abnormality visualized.

Right ovary

Measurements: 4.1 x 2.2 x 2.8 cm. Tiny 1.3 cm complex hypoechoic
focus in the ovary most likely resolving corpus luteal cyst.
Follow-up exam to demonstrate resolution can be obtained.. Normal
appearance/no adnexal mass.

Left ovary

Measurements: 3.3 x 1.9 x 3.0 cm. Normal appearance/no adnexal mass.

Pulsed Doppler evaluation of both ovaries demonstrates normal
low-resistance arterial and venous waveforms.

Other findings

Small amount of free pelvic fluid.
IMPRESSION: 1. Tiny 1.3 cm complex hypoechoic focus in the right ovary most
likely resolving corpus luteal cyst.

2. Small amount of free pelvic fluid. Exam is otherwise
unremarkable. No evidence of torsion.

## 2020-06-11 DIAGNOSIS — E119 Type 2 diabetes mellitus without complications: Secondary | ICD-10-CM

## 2020-06-11 HISTORY — DX: Type 2 diabetes mellitus without complications: E11.9

## 2020-08-17 ENCOUNTER — Encounter: Payer: Self-pay | Admitting: Emergency Medicine

## 2020-08-17 ENCOUNTER — Ambulatory Visit
Admission: EM | Admit: 2020-08-17 | Discharge: 2020-08-17 | Disposition: A | Discharge status: Home / Self Care | Payer: Medicaid Other

## 2020-08-17 ENCOUNTER — Other Ambulatory Visit: Payer: Self-pay

## 2020-08-17 DIAGNOSIS — B349 Viral infection, unspecified: Secondary | ICD-10-CM

## 2020-08-17 DIAGNOSIS — R52 Pain, unspecified: Secondary | ICD-10-CM | POA: Diagnosis not present

## 2020-08-17 HISTORY — DX: Migraine, unspecified, not intractable, without status migrainosus: G43.909

## 2020-08-17 LAB — POCT FASTING CBG KUC MANUAL ENTRY: POCT Glucose (KUC): 91 mg/dL (ref 70–99)

## 2020-08-17 MED ORDER — IBUPROFEN 800 MG PO TABS: 800.0000 mg | ORAL_TABLET | Freq: Three times a day (TID) | ORAL | 0 refills | Status: DC

## 2020-08-17 MED ORDER — FLUTICASONE PROPIONATE 50 MCG/ACT NA SUSP: 1.0000 | Freq: Every day | NASAL | 0 refills | Status: DC

## 2020-08-17 MED ORDER — MECLIZINE HCL 25 MG PO TABS
25.0000 mg | ORAL_TABLET | Freq: Three times a day (TID) | ORAL | 0 refills | Status: AC | PRN
Start: 2020-08-17 — End: ?

## 2020-08-17 NOTE — Discharge Instructions (Signed)
Covid test pending, monitor my chart for results Alternate Tylenol and ibuprofen every 4 hours to help with body aches headaches fevers and chills Rest and fluids Follow-up if symptoms not improving or worsening

## 2020-08-17 NOTE — ED Provider Notes (Signed)
[facility]    CSN: [id number] Arrival date & time: 08/17/20  1546      History   Chief Complaint Chief Complaint  Patient presents with  . Generalized Body Aches  . Nasal Congestion  . Nausea  . Headache    HPI Alejandra Clark is a 30 y.o. female presenting today for evaluation of URI symptoms.  Reports that she has had congestion body aches nausea chills and headache.  Symptoms began 2 days ago.  She reports some associated shortness of breath and confusion.  She is not taking any medicines for symptoms.  Describes confusion as combination of difficulty concentrating, being dazed as well as difficulty doing tasks she typically does with these.  She has also had associated dizziness which she describes as feeling seasick and on a boat with associated nausea.  She reports that her symptoms feel very similar to when she previously had Covid.  Reports Covid exposure recently.  HPI  Past Medical History:  Diagnosis Date  . Diabetes mellitus without complication (HCC) 06/11/2020  . Endometriosis   . Migraines     There are no problems to display for this patient.   History reviewed. No pertinent surgical history.  OB History    Gravida  0   Para  0   Term  0   Preterm  0   AB  0   Living  0     SAB  0   IAB  0   Ectopic  0   Multiple  0   Live Births  0            Home Medications    Prior to Admission medications   Medication Sig Start Date End Date Taking? Authorizing Provider  fluticasone (FLONASE) 50 MCG/ACT nasal spray Place 1-2 sprays into both nostrils daily. 08/17/20  Yes Audrie Kuri C, PA-C  ibuprofen (ADVIL) 800 MG tablet Take 1 tablet (800 mg total) by mouth 3 (three) times daily. 08/17/20  Yes Rekisha Welling C, PA-C  meclizine (ANTIVERT) 25 MG tablet Take 1 tablet (25 mg total) by mouth 3 (three) times daily as needed for dizziness. 08/17/20  Yes Letonia Stead C, PA-C  metFORMIN (GLUCOPHAGE) 500 MG tablet Take by mouth 2  (two) times daily with a meal.   Yes [provider]  topiramate (TOPAMAX) 50 MG tablet Take 50 mg by mouth 2 (two) times daily.   Yes [provider]  acetaminophen (TYLENOL) 500 MG tablet Take 2 tablets (1,000 mg total) every 6 (six) hours as needed by mouth. 06/27/17   Arby Barrette, MD  cephALEXin (KEFLEX) 500 MG capsule Take 2 capsules (1,000 mg total) 2 (two) times daily by mouth. 06/27/17   Arby Barrette, MD  ferrous sulfate 325 (65 FE) MG tablet Take 1 tablet (325 mg total) by mouth daily. 08/27/17   Petrucelli, Pleas Koch, PA-C  HYDROcodone-acetaminophen (NORCO/VICODIN) 5-325 MG tablet Take 1 tablet every 6 (six) hours as needed by mouth for severe pain. 06/26/17   Antony Madura, PA-C  metroNIDAZOLE (FLAGYL) 500 MG tablet Take 1 tablet (500 mg total) by mouth 2 (two) times daily. 08/27/17   Petrucelli, Floraine R, PA-C  naproxen (NAPROSYN) 500 MG tablet Take 1 tablet (500 mg total) by mouth 2 (two) times daily. 08/27/17   Petrucelli, Mirabel R, PA-C  traMADol (ULTRAM) 50 MG tablet Take 1 tablet (50 mg total) by mouth every 6 (six) hours as needed. 08/27/17   Petrucelli, Pleas Koch, PA-C  Family History Family History  Problem Relation Age of Onset  . Hypertension Other   . Diabetes Other   . Kidney failure Other     Social History Social History   Tobacco Use  . Smoking status: Never Smoker  . Smokeless tobacco: Never Used  Vaping Use  . Vaping Use: Some days  Substance Use Topics  . Alcohol use: Yes    Comment: social  . Drug use: No     Allergies   Penicillins and Tramadol   Review of Systems Review of Systems  Constitutional: Positive for chills and fatigue. Negative for activity change, appetite change and fever.  HENT: Positive for congestion. Negative for ear pain, rhinorrhea, sinus pressure, sore throat and trouble swallowing.   Eyes: Negative for discharge and redness.  Respiratory: Positive for shortness of breath. Negative for cough  and chest tightness.   Cardiovascular: Negative for chest pain.  Gastrointestinal: Negative for abdominal pain, diarrhea, nausea and vomiting.  Musculoskeletal: Negative for myalgias.  Skin: Negative for rash.  Neurological: Positive for dizziness and headaches. Negative for light-headedness.     Physical Exam Triage Vital Signs ED Triage Vitals  Enc Vitals Group     BP 08/17/20 1812 120/89     Pulse Rate 08/17/20 1812 75     Resp 08/17/20 1812 18     Temp 08/17/20 1812 98.2 F (36.8 C)     Temp Source 08/17/20 1812 Oral     SpO2 08/17/20 1812 97 %     Weight 08/17/20 1808 225 lb (102.1 kg)     Height 08/17/20 1808 5\' 3"  (1.6 m)     Head Circumference --      Peak Flow --      Pain Score 08/17/20 1807 4     Pain Loc --      Pain Edu? --      Excl. in GC? --    No data found.  Updated Vital Signs BP 120/89 (BP Location: Right Arm)   Pulse 75   Temp 98.2 F (36.8 C) (Oral)   Resp 18   Ht 5\' 3"  (1.6 m)   Wt 225 lb (102.1 kg)   LMP 07/27/2020   SpO2 97%   BMI 39.86 kg/m   Visual Acuity Right Eye Distance:   Left Eye Distance:   Bilateral Distance:    Right Eye Near:   Left Eye Near:    Bilateral Near:     Physical Exam Vitals and nursing note reviewed.  Constitutional:      Appearance: She is well-developed and well-nourished.     Comments: No acute distress, speaking clearly  HENT:     Head: Normocephalic and atraumatic.     Ears:     Comments: Bilateral ears without tenderness to palpation of external auricle, tragus and mastoid, EAC's without erythema or swelling, TM's with good bony landmarks and cone of light. Non erythematous.  Right effusion    Nose: Nose normal.     Mouth/Throat:     Comments: Oral mucosa pink and moist, no tonsillar enlargement or exudate. Posterior pharynx patent and nonerythematous, no uvula deviation or swelling. Normal phonation. Eyes:     Conjunctiva/sclera: Conjunctivae normal.  Cardiovascular:     Rate and Rhythm:  Normal rate.  Pulmonary:     Effort: Pulmonary effort is normal. No respiratory distress.     Comments: Breathing comfortably at rest, CTABL, no wheezing, rales or other adventitious sounds auscultated Abdominal:     General: There  is no distension.  Musculoskeletal:        General: Normal range of motion.     Cervical back: Neck supple.  Skin:    General: Skin is warm and dry.  Neurological:     General: No focal deficit present.     Mental Status: She is alert and oriented to person, place, and time. Mental status is at baseline.     Cranial Nerves: No cranial nerve deficit.     Motor: No weakness.  Psychiatric:        Mood and Affect: Mood and affect normal.      UC Treatments / Results  Labs (all labs ordered are listed, but only abnormal results are displayed) Labs Reviewed  NOVEL CORONAVIRUS, NAA  POCT FASTING CBG [facility] MANUAL ENTRY    EKG   Radiology No results found.  Procedures Procedures (including critical care time)  Medications Ordered in UC Medications - No data to display  Initial Impression / Assessment and Plan / UC Course  I have reviewed the triage vital signs and the nursing notes.  Pertinent labs & imaging results that were available during my care of the patient were reviewed by me and considered in my medical decision making (see chart for details).     Suspect viral etiology, 2 days of symptoms, Covid test pending.  Exam reassuring, but towards discharge patient became very dizzy, provided Zofran 4 mg, and water, monitored for an additional 30 to 40 minutes, symptoms did subside moderately, discharge patient.  Advised patient if she is developing worsening dizziness along with her confusion or any worsening headaches to go to the emergency room.  Dizziness description seems more like vertigo likely secondary from underlying viral URI/effusion.  Provided Flonase and meclizine to trial.  Discussed strict return precautions. Patient verbalized  understanding and is agreeable with plan.  Final Clinical Impressions(s) / UC Diagnoses   Final diagnoses:  Viral illness  Generalized body aches     Discharge Instructions     Covid test pending, monitor my chart for results Alternate Tylenol and ibuprofen every 4 hours to help with body aches headaches fevers and chills Rest and fluids Follow-up if symptoms not improving or worsening    ED Prescriptions    Medication Sig Dispense Auth. Provider   ibuprofen (ADVIL) 800 MG tablet Take 1 tablet (800 mg total) by mouth 3 (three) times daily. 21 tablet [employer] C, PA-C   meclizine (ANTIVERT) 25 MG tablet Take 1 tablet (25 mg total) by mouth 3 (three) times daily as needed for dizziness. 30 tablet Makynzee Tigges C, PA-C   fluticasone (FLONASE) 50 MCG/ACT nasal spray Place 1-2 sprays into both nostrils daily. 16 g Kaiya Boatman, [place] C, PA-C     PDMP not reviewed this encounter.   Lew Dawes, [place] 08/18/20 [phone]

## 2020-08-17 NOTE — ED Triage Notes (Signed)
Patient c/o runny nose, generalized body aches, nausea, chills, and headache x 2 days.   Patient denies fever and cough.   Patient endorses SOB and confusion.   Patient hasn't taken any medications for symptoms.   History of Migraines.

## 2020-08-21 LAB — NOVEL CORONAVIRUS, NAA: SARS-CoV-2, NAA: NOT DETECTED

## 2020-11-28 ENCOUNTER — Ambulatory Visit
Admission: RE | Admit: 2020-11-28 | Discharge: 2020-11-28 | Disposition: A | Discharge status: Home / Self Care | Payer: Medicaid Other | Source: Ambulatory Visit | Attending: Emergency Medicine | Admitting: Emergency Medicine

## 2020-11-28 ENCOUNTER — Other Ambulatory Visit: Payer: Self-pay

## 2020-11-28 VITALS — BP 149/73 | HR 89 | Temp 99.5°F | Resp 18

## 2020-11-28 DIAGNOSIS — J069 Acute upper respiratory infection, unspecified: Secondary | ICD-10-CM | POA: Insufficient documentation

## 2020-11-28 LAB — POCT RAPID STREP A (OFFICE): Rapid Strep A Screen: NEGATIVE

## 2020-11-28 MED ORDER — FLUTICASONE PROPIONATE 50 MCG/ACT NA SUSP: 1.0000 | Freq: Every day | NASAL | 0 refills | Status: AC

## 2020-11-28 MED ORDER — CETIRIZINE HCL 10 MG PO CAPS: 10.0000 mg | ORAL_CAPSULE | Freq: Every day | ORAL | 0 refills | Status: AC

## 2020-11-28 MED ORDER — BENZONATATE 200 MG PO CAPS: 200.0000 mg | ORAL_CAPSULE | Freq: Three times a day (TID) | ORAL | 0 refills | Status: AC | PRN

## 2020-11-28 MED ORDER — IBUPROFEN 800 MG PO TABS: 800.0000 mg | ORAL_TABLET | Freq: Three times a day (TID) | ORAL | 0 refills | Status: DC

## 2020-11-28 NOTE — ED Provider Notes (Signed)
[facility]    CSN: [id number] Arrival date & time: 11/28/20  1239      History   Chief Complaint Chief Complaint  Patient presents with  . Appointment    1300  . Sore Throat    HPI Alejandra Clark is a 30 y.o. female history of migraines presenting today for evaluation of URI symptoms.  Reports 2 days of fevers, headaches, body aches, chills, sore throat cough and congestion.  Unsure of any COVID/flu exposures.  Denies any GI symptoms.  HPI  Past Medical History:  Diagnosis Date  . Diabetes mellitus without complication (HCC) 06/11/2020  . Endometriosis   . Migraines     There are no problems to display for this patient.   History reviewed. No pertinent surgical history.  OB History    Gravida  0   Para  0   Term  0   Preterm  0   AB  0   Living  0     SAB  0   IAB  0   Ectopic  0   Multiple  0   Live Births  0            Home Medications    Prior to Admission medications   Medication Sig Start Date End Date Taking? Authorizing Provider  benzonatate (TESSALON) 200 MG capsule Take 1 capsule (200 mg total) by mouth 3 (three) times daily as needed for up to 7 days for cough. 11/28/20 12/05/20 Yes Latana Colin C, PA-C  Cetirizine HCl 10 MG CAPS Take 1 capsule (10 mg total) by mouth daily for 10 days. 11/28/20 12/08/20 Yes Auburn Hester C, PA-C  fluticasone (FLONASE) 50 MCG/ACT nasal spray Place 1-2 sprays into both nostrils daily. 11/28/20  Yes Shuna Tabor C, PA-C  ibuprofen (ADVIL) 800 MG tablet Take 1 tablet (800 mg total) by mouth 3 (three) times daily. 11/28/20  Yes Amory Zbikowski C, PA-C  acetaminophen (TYLENOL) 500 MG tablet Take 2 tablets (1,000 mg total) every 6 (six) hours as needed by mouth. 06/27/17   Arby Barrette, MD  ferrous sulfate 325 (65 FE) MG tablet Take 1 tablet (325 mg total) by mouth daily. 08/27/17   Petrucelli, Pleas Koch, PA-C  meclizine (ANTIVERT) 25 MG tablet Take 1 tablet (25 mg total) by mouth 3  (three) times daily as needed for dizziness. 08/17/20   Demetruis Depaul C, PA-C  metFORMIN (GLUCOPHAGE) 500 MG tablet Take by mouth 2 (two) times daily with a meal.    [provider]  topiramate (TOPAMAX) 50 MG tablet Take 50 mg by mouth 2 (two) times daily.    [provider]  traMADol (ULTRAM) 50 MG tablet Take 1 tablet (50 mg total) by mouth every 6 (six) hours as needed. 08/27/17   Petrucelli, Pleas Koch, PA-C    Family History Family History  Problem Relation Age of Onset  . Hypertension Other   . Diabetes Other   . Kidney failure Other     Social History Social History   Tobacco Use  . Smoking status: Never Smoker  . Smokeless tobacco: Never Used  Vaping Use  . Vaping Use: Some days  Substance Use Topics  . Alcohol use: Yes    Comment: social  . Drug use: No     Allergies   Penicillins and Tramadol   Review of Systems Review of Systems  Constitutional: Negative for activity change, appetite change, chills, fatigue and fever.  HENT: Positive for congestion, rhinorrhea, sinus pressure  and sore throat. Negative for ear pain and trouble swallowing.   Eyes: Negative for discharge and redness.  Respiratory: Positive for cough. Negative for chest tightness and shortness of breath.   Cardiovascular: Negative for chest pain.  Gastrointestinal: Negative for abdominal pain, diarrhea, nausea and vomiting.  Musculoskeletal: Negative for myalgias.  Skin: Negative for rash.  Neurological: Negative for dizziness, light-headedness and headaches.     Physical Exam Triage Vital Signs ED Triage Vitals  Enc Vitals Group     BP      Pulse      Resp      Temp      Temp src      SpO2      Weight      Height      Head Circumference      Peak Flow      Pain Score      Pain Loc      Pain Edu?      Excl. in GC?    No data found.  Updated Vital Signs BP (!) 149/73 (BP Location: Right Arm)   Pulse 89   Temp 99.5 F (37.5 C) (Oral)   Resp 18   SpO2 98%    Visual Acuity Right Eye Distance:   Left Eye Distance:   Bilateral Distance:    Right Eye Near:   Left Eye Near:    Bilateral Near:     Physical Exam Vitals and nursing note reviewed.  Constitutional:      Appearance: She is well-developed.     Comments: No acute distress  HENT:     Head: Normocephalic and atraumatic.     Ears:     Comments: Bilateral ears without tenderness to palpation of external auricle, tragus and mastoid, EAC's without erythema or swelling, TM's with good bony landmarks and cone of light. Non erythematous.     Nose: Nose normal.     Mouth/Throat:     Comments: Oral mucosa pink and moist, no tonsillar enlargement or exudate. Posterior pharynx patent and nonerythematous, no uvula deviation or swelling. Normal phonation. Eyes:     Conjunctiva/sclera: Conjunctivae normal.  Cardiovascular:     Rate and Rhythm: Normal rate.  Pulmonary:     Effort: Pulmonary effort is normal. No respiratory distress.     Comments: Breathing comfortably at rest, CTABL, no wheezing, rales or other adventitious sounds auscultated Abdominal:     General: There is no distension.  Musculoskeletal:        General: Normal range of motion.     Cervical back: Neck supple.  Skin:    General: Skin is warm and dry.  Neurological:     Mental Status: She is alert and oriented to person, place, and time.      UC Treatments / Results  Labs (all labs ordered are listed, but only abnormal results are displayed) Labs Reviewed  CULTURE, GROUP A STREP ([facility])  NOVEL CORONAVIRUS, NAA  POCT RAPID STREP A (OFFICE)    EKG   Radiology No results found.  Procedures Procedures (including critical care time)  Medications Ordered in UC Medications - No data to display  Initial Impression / Assessment and Plan / UC Course  I have reviewed the triage vital signs and the nursing notes.  Pertinent labs & imaging results that were available during my care of the patient were reviewed  by me and considered in my medical decision making (see chart for details).     Viral URI with  cough-strep negative, COVID pending, suspect viral etiology, exam reassuring, recommend symptomatic and supportive care rest and fluids.  Discussed strict return precautions. Patient verbalized understanding and is agreeable with plan.  Final Clinical Impressions(s) / UC Diagnoses   Final diagnoses:  Viral URI with cough     Discharge Instructions     Strep negative, COVID test pending Tylenol and ibuprofen for fevers, headaches, body aches, sore throat Daily cetirizine and Flonase to help with congestion, postnasal drainage/throat irritation Tessalon every 8 hours for cough Rest and fluids Follow-up if not improving or worsening     ED Prescriptions    Medication Sig Dispense Auth. Provider   ibuprofen (ADVIL) 800 MG tablet Take 1 tablet (800 mg total) by mouth 3 (three) times daily. 21 tablet Leory Allinson C, PA-C   Cetirizine HCl 10 MG CAPS Take 1 capsule (10 mg total) by mouth daily for 10 days. 10 capsule [place] Safer C, PA-C   fluticasone (FLONASE) 50 MCG/ACT nasal spray Place 1-2 sprays into both nostrils daily. 16 g Lakiyah Arntson C, PA-C   benzonatate (TESSALON) 200 MG capsule Take 1 capsule (200 mg total) by mouth 3 (three) times daily as needed for up to 7 days for cough. 28 capsule Breyton Vanscyoc, [place] C, PA-C     PDMP not reviewed this encounter.   Lew Dawes, [place] 11/28/20 1333

## 2020-11-28 NOTE — Discharge Instructions (Addendum)
Strep negative, COVID test pending Tylenol and ibuprofen for fevers, headaches, body aches, sore throat Daily cetirizine and Flonase to help with congestion, postnasal drainage/throat irritation Tessalon every 8 hours for cough Rest and fluids Follow-up if not improving or worsening

## 2020-11-28 NOTE — ED Triage Notes (Signed)
Pt here for sore throat and URI sx x 2 days  

## 2020-11-29 LAB — SARS-COV-2, NAA 2 DAY TAT

## 2020-11-29 LAB — CULTURE, GROUP A STREP (THRC)

## 2020-11-29 LAB — NOVEL CORONAVIRUS, NAA: SARS-CoV-2, NAA: DETECTED — AB

## 2020-11-30 ENCOUNTER — Telehealth: Payer: Self-pay

## 2020-11-30 NOTE — Telephone Encounter (Signed)
Called to discuss with patient about COVID-19 symptoms and the use of one of the available treatments for those with mild to moderate Covid symptoms and at a high risk of hospitalization.  Pt appears to qualify for outpatient treatment due to co-morbid conditions and/or a member of an at-risk group in accordance with the FDA Emergency Use Authorization.    Symptom onset: 11/26/20 Cough,sore throat,chills Vaccinated: No Booster? No Immunocompromised?  Qualifiers: DM  Pt. Declines any further treatment.   Alejandra Clark

## 2020-12-01 LAB — CULTURE, GROUP A STREP (THRC)

## 2020-12-05 ENCOUNTER — Other Ambulatory Visit: Payer: Self-pay

## 2020-12-05 ENCOUNTER — Emergency Department (HOSPITAL_COMMUNITY): Payer: Medicaid Other

## 2020-12-05 ENCOUNTER — Encounter (HOSPITAL_COMMUNITY): Payer: Self-pay

## 2020-12-05 ENCOUNTER — Emergency Department (HOSPITAL_COMMUNITY)
Admission: EM | Admit: 2020-12-05 | Discharge: 2020-12-05 | Disposition: A | Discharge status: Home / Self Care | Payer: Medicaid Other | Attending: Emergency Medicine | Admitting: Emergency Medicine

## 2020-12-05 DIAGNOSIS — Z28310 Unvaccinated for covid-19: Secondary | ICD-10-CM | POA: Insufficient documentation

## 2020-12-05 DIAGNOSIS — R059 Cough, unspecified: Secondary | ICD-10-CM | POA: Diagnosis present

## 2020-12-05 DIAGNOSIS — Z7984 Long term (current) use of oral hypoglycemic drugs: Secondary | ICD-10-CM | POA: Diagnosis not present

## 2020-12-05 DIAGNOSIS — E119 Type 2 diabetes mellitus without complications: Secondary | ICD-10-CM | POA: Insufficient documentation

## 2020-12-05 DIAGNOSIS — U071 COVID-19: Secondary | ICD-10-CM | POA: Insufficient documentation

## 2020-12-05 NOTE — ED Provider Notes (Signed)
[facility]-EMERGENCY DEPT Provider Note   CSN: [id number] Arrival date & time: 12/05/20  1442     History Chief Complaint  Patient presents with  . Generalized Body Aches    Alejandra Clark is a 30 y.o. female with no pertinent past medical history that presents to the ER for continued covid symptoms. Pt states that she was diagnosed with COVID  7 days ago. Was not vaccinated. Complaining of generalized body aches, shortness of breath, cough. Is not immunocompromised. No diarrhea, vomiting, fevers, sore throat. Has not been taking anything for this. No chest pain.   HPI     Past Medical History:  Diagnosis Date  . Diabetes mellitus without complication (HCC) 06/11/2020  . Endometriosis   . Migraines     There are no problems to display for this patient.   History reviewed. No pertinent surgical history.   OB History    Gravida  0   Para  0   Term  0   Preterm  0   AB  0   Living  0     SAB  0   IAB  0   Ectopic  0   Multiple  0   Live Births  0           Family History  Problem Relation Age of Onset  . Hypertension Other   . Diabetes Other   . Kidney failure Other     Social History   Tobacco Use  . Smoking status: Never Smoker  . Smokeless tobacco: Never Used  Vaping Use  . Vaping Use: Some days  Substance Use Topics  . Alcohol use: Yes    Comment: social  . Drug use: No    Home Medications Prior to Admission medications   Medication Sig Start Date End Date Taking? Authorizing Provider  acetaminophen (TYLENOL) 500 MG tablet Take 2 tablets (1,000 mg total) every 6 (six) hours as needed by mouth. 06/27/17   Arby Barrette, MD  benzonatate (TESSALON) 200 MG capsule Take 1 capsule (200 mg total) by mouth 3 (three) times daily as needed for up to 7 days for cough. 11/28/20 12/05/20  Wieters, Hallie C, PA-C  Cetirizine HCl 10 MG CAPS Take 1 capsule (10 mg total) by mouth daily for 10 days. 11/28/20 12/08/20  Wieters,  Hallie C, PA-C  ferrous sulfate 325 (65 FE) MG tablet Take 1 tablet (325 mg total) by mouth daily. 08/27/17   Petrucelli, Ermelinda R, PA-C  fluticasone (FLONASE) 50 MCG/ACT nasal spray Place 1-2 sprays into both nostrils daily. 11/28/20   Wieters, Hallie C, PA-C  ibuprofen (ADVIL) 800 MG tablet Take 1 tablet (800 mg total) by mouth 3 (three) times daily. 11/28/20   Wieters, Hallie C, PA-C  meclizine (ANTIVERT) 25 MG tablet Take 1 tablet (25 mg total) by mouth 3 (three) times daily as needed for dizziness. 08/17/20   Wieters, Hallie C, PA-C  metFORMIN (GLUCOPHAGE) 500 MG tablet Take by mouth 2 (two) times daily with a meal.    [provider]  topiramate (TOPAMAX) 50 MG tablet Take 50 mg by mouth 2 (two) times daily.    [provider]  traMADol (ULTRAM) 50 MG tablet Take 1 tablet (50 mg total) by mouth every 6 (six) hours as needed. 08/27/17   Petrucelli, Demeisha R, PA-C    Allergies    Penicillins and Tramadol  Review of Systems   Review of Systems  Constitutional: Negative for chills, diaphoresis, fatigue and fever.  HENT: Negative for congestion, sore throat and trouble swallowing.   Eyes: Negative for pain and visual disturbance.  Respiratory: Positive for cough and shortness of breath. Negative for wheezing.   Cardiovascular: Negative for chest pain, palpitations and leg swelling.  Gastrointestinal: Negative for abdominal distention, abdominal pain, diarrhea, nausea and vomiting.  Genitourinary: Negative for difficulty urinating.  Musculoskeletal: Positive for arthralgias and myalgias. Negative for back pain, neck pain and neck stiffness.  Skin: Negative for pallor.  Neurological: Negative for dizziness, speech difficulty, weakness and headaches.  Psychiatric/Behavioral: Negative for confusion.    Physical Exam Updated Vital Signs BP (!) 143/76 (BP Location: Left Arm)   Pulse 79   Temp 98.3 F (36.8 C) (Oral)   Resp 15   LMP 11/28/2020   SpO2 98%   Physical  Exam Constitutional:      General: She is not in acute distress.    Appearance: Normal appearance. She is not ill-appearing, toxic-appearing or diaphoretic.     Comments: Patient without acute respiratory stress.  Patient is sitting comfortably in bed, no tripoding, use of accessory muscles.  Patient is speaking to me in full sentences.  Handling secretions well.  HENT:     Head: Normocephalic and atraumatic.     Jaw: There is normal jaw occlusion. No trismus, swelling or malocclusion.     Nose: No congestion or rhinorrhea.     Right Sinus: No maxillary sinus tenderness or frontal sinus tenderness.     Left Sinus: No maxillary sinus tenderness or frontal sinus tenderness.     Mouth/Throat:     Mouth: Mucous membranes are moist. No oral lesions.     Dentition: Normal dentition.     Tongue: No lesions.     Palate: No mass and lesions.     Pharynx: Oropharynx is clear. Uvula midline. No pharyngeal swelling, oropharyngeal exudate, posterior oropharyngeal erythema or uvula swelling.     Tonsils: No tonsillar exudate or tonsillar abscesses. 1+ on the right. 1+ on the left.     Comments: Patient without tonsillar enlargement or exudate.  No signs of peritonsillar abscess, palate without any tenderness or masses palpated.  No swelling under the tongue, uvula is midline without any inflammation. Eyes:     General: No visual field deficit.       Right eye: No discharge.        Left eye: No discharge.     Extraocular Movements: Extraocular movements intact.     Conjunctiva/sclera: Conjunctivae normal.     Pupils: Pupils are equal, round, and reactive to light.  Cardiovascular:     Rate and Rhythm: Normal rate and regular rhythm.     Pulses: Normal pulses.     Heart sounds: Normal heart sounds. No murmur heard. No friction rub. No gallop.   Pulmonary:     Effort: Pulmonary effort is normal. No respiratory distress.     Breath sounds: Normal breath sounds. No stridor. No wheezing, rhonchi or  rales.  Chest:     Chest wall: No tenderness.  Abdominal:     General: Abdomen is flat. Bowel sounds are normal. There is no distension.     Palpations: Abdomen is soft.     Tenderness: There is no abdominal tenderness. There is no right CVA tenderness or left CVA tenderness.  Musculoskeletal:        General: No swelling or tenderness. Normal range of motion.     Cervical back: Normal range of motion. No rigidity or tenderness.  Right lower leg: No edema.     Left lower leg: No edema.  Lymphadenopathy:     Cervical: No cervical adenopathy.  Skin:    General: Skin is warm and dry.     Capillary Refill: Capillary refill takes less than 2 seconds.     Findings: No erythema or rash.  Neurological:     General: No focal deficit present.     Mental Status: She is alert and oriented to person, place, and time.     Cranial Nerves: Cranial nerves are intact. No cranial nerve deficit or facial asymmetry.     Motor: Motor function is intact. No weakness.     Coordination: Coordination is intact.     Gait: Gait is intact. Gait normal.  Psychiatric:        Mood and Affect: Mood normal.     ED Results / Procedures / Treatments   Labs (all labs ordered are listed, but only abnormal results are displayed) Labs Reviewed - No data to display  EKG None  Radiology DG Chest [facility] 1 View  Result Date: 12/05/2020 CLINICAL DATA:  Short of breath.  COVID positive EXAM: PORTABLE CHEST 1 VIEW COMPARISON:  None. FINDINGS: The heart size and mediastinal contours are within normal limits. Both lungs are clear. The visualized skeletal structures are unremarkable. IMPRESSION: No active disease. Electronically Signed   By: Marlan Palau M.D.   On: 12/05/2020 15:21    Procedures Procedures   Medications Ordered in ED Medications - No data to display  ED Course  I have reviewed the triage vital signs and the nursing notes.  Pertinent labs & imaging results that were available during my care of  the patient were reviewed by me and considered in my medical decision making (see chart for details).    MDM Rules/Calculators/A&P                          Pt presents with COVID symptoms for 7 days, is mild, pt is low risk. CXR interpreted by me without signs of acute cardiopulmonary disease. No chest pain. No respiratory distress, sating at 100 percent, upon ambulation.  Doubt need for further emergent work up at this time. I explained the diagnosis and have given explicit precautions to return to the ER including for any other new or worsening symptoms. The patient understands and accepts the medical plan as it's been dictated and I have answered their questions. Discharge instructions concerning home care and prescriptions have been given. The patient is STABLE and is discharged to home in good condition.   Final Clinical Impression(s) / ED Diagnoses Final diagnoses:  COVID    Rx / DC Orders ED Discharge Orders    None       Farrel Gordon, PA-C 12/05/20 1752    Melene Plan, DO 12/05/20 Rickey Primus

## 2020-12-05 NOTE — ED Notes (Signed)
Pt ambulated / O2 remained 99

## 2020-12-05 NOTE — ED Triage Notes (Signed)
Pt arrived via walk in, c/o generalized body aches and chills. Dx with covid (+) 4/20. States sx have continued.

## 2020-12-05 NOTE — Discharge Instructions (Signed)
  You were evaluated in the Emergency Department and after careful evaluation, we did not find any emergent condition requiring admission or further testing in the hospital.   Your exam/testing today was overall reassuring.  Symptoms seem to be due to  COVID.please use the attached instructions and follow up with the covid clinic  Please return to the Emergency Department if you experience any worsening of your condition.  Thank you for allowing [place] to be a part of your care. Please speak to your pharmacist about any new medications prescribed today in regards to side effects or interactions with other medications.

## 2020-12-05 NOTE — ED Triage Notes (Signed)
Emergency Medicine Provider Triage Evaluation Note  Jadeyn Hargett , a 30 y.o. female  was evaluated in triage.  Pt complains of shortness of breath after being diagnosed with COVID last Wednesday. Also complain of body being sore and HA. Not vaccinated.  Review of Systems  Positive: Sob, ha, myalgias Negative: fever  Physical Exam  BP (!) 143/76 (BP Location: Left Arm)   Pulse 79   Temp 98.3 F (36.8 C) (Oral)   Resp 15   SpO2 98%  Gen:   Awake, no distress   HEENT:  Atraumatic  Resp:  Normal effort  Cardiac:  Normal rate  Abd:   Nondistended, nontender  MSK:   Moves extremities without difficulty  Neuro:  Speech clear   Medical Decision Making  Medically screening exam initiated at 2:57 PM.  Appropriate orders placed.  Abrar Koone was informed that the remainder of the evaluation will be completed by another provider, this initial triage assessment does not replace that evaluation, and the importance of remaining in the ED until their evaluation is complete.  Clinical Impression  Stable, no respiratory distress  MSE was initiated and I personally evaluated the patient and placed orders (if any) at  2:58 PM on December 05, 2020.  The patient appears stable so that the remainder of the MSE may be completed by another provider.    Farrel Gordon, PA-C 12/05/20 1458

## 2021-02-26 ENCOUNTER — Other Ambulatory Visit: Payer: Self-pay

## 2021-02-26 ENCOUNTER — Ambulatory Visit
Admission: EM | Admit: 2021-02-26 | Discharge: 2021-02-26 | Disposition: A | Discharge status: Home / Self Care | Payer: Medicaid Other | Attending: Emergency Medicine | Admitting: Emergency Medicine

## 2021-02-26 ENCOUNTER — Encounter: Payer: Self-pay | Admitting: Emergency Medicine

## 2021-02-26 DIAGNOSIS — R600 Localized edema: Secondary | ICD-10-CM

## 2021-02-26 NOTE — ED Triage Notes (Signed)
Pt here with 1 month long heavy menstrual cycle with bilateral LE pitting edema x 1 day. Severe headache and weakness.

## 2021-02-26 NOTE — Discharge Instructions (Addendum)
Go to any one of the emergency departments listed above.  We need to make sure that this is not your kidneys, your heart.  You can be safely started on a diuretic if necessary after an appropriate work-up.

## 2021-02-26 NOTE — ED Provider Notes (Signed)
HPI  SUBJECTIVE:  Alejandra Clark is a 30 y.o. female who presents with bilateral lower extremity edema for the past 10 days worse on the left, decreased urine output, 5 pound unintentional weight gain.  She reports bilateral calf pain described as pressure and left knee pain.  She denies fevers, new or different chest pain.  She has had chest pains since having COVID 2 months ago.  No shortness of breath, coughing, wheezing, orthopnea, nocturia, PND, abdominal pain, dyspnea on exertion, change in color urine, foamy urine.  She denies headaches during this time.  No recent viral illness.  No recent dietary changes, increase in salty foods.  No surgery in the past 4 weeks.  No hemoptysis, recent immobilization, trauma to the legs, exogenous estrogen.  No change in physical activity, she has not been on her feet more recently.  She states that the lower extremity edema does not change, even with elevation.  She reports decreased urine output although she is drinking normally.  She states when she has lower extremity edema she normally urinates more.  She had a rapid negative COVID today as today and has a PCR pending.  She has a past medical history of endometriosis, migraines, diabetes COVID on 4/27, hypertension.  She is not on any calcium channel blockers.  No history of CHF, PE, DVT, cancer, nephrotic syndrome.  LMP: Now.  She denies possibility being pregnant.  PMD: [facility] in [place].    Past Medical History:  Diagnosis Date   Diabetes mellitus without complication (HCC) 06/11/2020   Endometriosis    Migraines     History reviewed. No pertinent surgical history.  Family History  Problem Relation Age of Onset   Hypertension Other    Diabetes Other    Kidney failure Other     Social History   Tobacco Use   Smoking status: Never   Smokeless tobacco: Never  Vaping Use   Vaping Use: Some days  Substance Use Topics   Alcohol use: Yes    Comment: social   Drug use: No    No  current facility-administered medications for this encounter.  Current Outpatient Medications:    acetaminophen (TYLENOL) 500 MG tablet, Take 2 tablets (1,000 mg total) every 6 (six) hours as needed by mouth., Disp: 30 tablet, Rfl: 0   Cetirizine HCl 10 MG CAPS, Take 1 capsule (10 mg total) by mouth daily for 10 days., Disp: 10 capsule, Rfl: 0   ferrous sulfate 325 (65 FE) MG tablet, Take 1 tablet (325 mg total) by mouth daily., Disp: 30 tablet, Rfl: 0   fluticasone (FLONASE) 50 MCG/ACT nasal spray, Place 1-2 sprays into both nostrils daily., Disp: 16 g, Rfl: 0   meclizine (ANTIVERT) 25 MG tablet, Take 1 tablet (25 mg total) by mouth 3 (three) times daily as needed for dizziness., Disp: 30 tablet, Rfl: 0   metFORMIN (GLUCOPHAGE) 500 MG tablet, Take by mouth 2 (two) times daily with a meal., Disp: , Rfl:    topiramate (TOPAMAX) 50 MG tablet, Take 50 mg by mouth 2 (two) times daily., Disp: , Rfl:   Allergies  Allergen Reactions   Penicillins    Tramadol     Causes anxiety attacks     ROS  As noted in HPI.   Physical Exam  BP 126/76   Pulse 92   Temp 97.6 F (36.4 C) (Oral)   Resp 20   SpO2 98%   Constitutional: Well developed, well nourished, no acute distress Eyes: PERRL, EOMI,  conjunctiva normal bilaterally HENT: Normocephalic, atraumatic,mucus membranes moist Respiratory: Clear to auscultation bilaterally, no rales, no wheezing, no rhonchi Cardiovascular: Normal rate and rhythm, no murmurs, no gallops, no rubs.  No appreciable JVD.  Positive shortness of breath when she lies down flat. GI: Soft, nondistended, normal bowel sounds, mild right upper quadrant tenderness, no appreciable hepatomegaly, no rebound, no guarding skin: No rash, skin intact Musculoskeletal: Bilateral 2+ pitting edema.  Positive popliteal, posterior midline calf tenderness right side.  Right calf 45 cm, left calf 46 cm.  Left calf nontender, no popliteal tenderness. Neurologic: Alert & oriented x 3, CN  III-XII grossly intact, no motor deficits, sensation grossly intact Psychiatric: Speech and behavior appropriate   ED Course   Medications - No data to display  No orders of the defined types were placed in this encounter.  No results found for this or any previous visit (from the past 24 hour(s)). No results found.  ED Clinical Impression  1. Bilateral lower extremity edema      ED Assessment/Plan   Patient with bilateral lower extremity edema.  We do not have access to labs at this facility, she states that she will not be able to see her primary care provider until sometime next week.  She reports decreased urine output with this swelling, wonder if this could be a nephrotic syndrome.  In the differential is congestive heart failure, but she does not appear to be fluid overloaded although she does have some shortness of breath when she lies down flat and some right upper quadrant tenderness.  DVT in the differential with the right calf tenderness, however, she has no tenderness in the left calf, left calf is bigger than the right, and I would not expect unilateral DVT to cause bilateral lower extremity edema.  Transferring to the emergency department for a further work-up and evaluation.  She may just need a diuretic for the lower extremity edema.  Discussed medical decision making, rationale for transfer to the emergency department with patient.  She agrees with plan.  No orders of the defined types were placed in this encounter.     *This clinic note was created using Dragon dictation software. Therefore, there may be occasional mistakes despite careful proofreading. ?    Domenick Gong, MD 02/26/21 2036

## 2021-12-15 ENCOUNTER — Emergency Department (HOSPITAL_COMMUNITY)
Admission: EM | Admit: 2021-12-15 | Discharge: 2021-12-15 | Discharge status: Against Medical Advice | Payer: Medicaid Other | Attending: Student | Admitting: Student

## 2021-12-15 ENCOUNTER — Emergency Department (HOSPITAL_COMMUNITY): Payer: Medicaid Other

## 2021-12-15 DIAGNOSIS — R0789 Other chest pain: Secondary | ICD-10-CM | POA: Diagnosis not present

## 2021-12-15 DIAGNOSIS — Z5321 Procedure and treatment not carried out due to patient leaving prior to being seen by health care provider: Secondary | ICD-10-CM | POA: Diagnosis not present

## 2021-12-15 DIAGNOSIS — R42 Dizziness and giddiness: Secondary | ICD-10-CM | POA: Diagnosis not present

## 2021-12-15 DIAGNOSIS — R519 Headache, unspecified: Secondary | ICD-10-CM | POA: Diagnosis present

## 2021-12-15 LAB — CBC
HCT: 38.2 % (ref 36.0–46.0)
Hemoglobin: 12.5 g/dL (ref 12.0–15.0)
MCH: 26.8 pg (ref 26.0–34.0)
MCHC: 32.7 g/dL (ref 30.0–36.0)
MCV: 81.8 fL (ref 80.0–100.0)
Platelets: 380 10*3/uL (ref 150–400)
RBC: 4.67 MIL/uL (ref 3.87–5.11)
RDW: 15.9 % — ABNORMAL HIGH (ref 11.5–15.5)
WBC: 8.6 10*3/uL (ref 4.0–10.5)
nRBC: 0 % (ref 0.0–0.2)

## 2021-12-15 LAB — I-STAT BETA HCG BLOOD, ED (MC, WL, AP ONLY): I-stat hCG, quantitative: <5 m[IU]/mL (ref <5)

## 2021-12-15 LAB — BASIC METABOLIC PANEL
Anion gap: 6 (ref 5–15)
BUN: 10 mg/dL (ref 6–20)
CO2: 25 mmol/L (ref 22–32)
Calcium: 9.6 mg/dL (ref 8.9–10.3)
Chloride: 106 mmol/L (ref 98–111)
Creatinine, Ser: 0.66 mg/dL (ref 0.44–1.00)
GFR, Estimated: >60 mL/min (ref >60)
Glucose, Bld: 141 mg/dL — ABNORMAL HIGH (ref 70–99)
Potassium: 4 mmol/L (ref 3.5–5.1)
Sodium: 137 mmol/L (ref 135–145)

## 2021-12-15 LAB — TROPONIN I (HIGH SENSITIVITY): Troponin I (High Sensitivity): <2 ng/L (ref <18)

## 2021-12-15 NOTE — ED Provider Triage Note (Signed)
Emergency Medicine Provider Triage Evaluation Note ? ?Alejandra Clark , a 31 y.o. female  was evaluated in triage.  Pt complains of headache for the last week, leg numbness and tingling, chest pain that started this morning upon waking.  Patient states the chest pain is located centrally, denies radiation.  Patient denies worsening of chest pain with inspiration.  Patient endorsing lightheadedness and dizziness on standing. ? ?Review of Systems  ?Positive:  ?Negative:  ? ?Physical Exam  ?BP (!) 129/93 (BP Location: Right Arm)   Pulse 90   Temp 98 ?F (36.7 ?C) (Oral)   Resp 18   Ht 5\' 3"  (1.6 m)   Wt 117.9 kg   LMP 11/30/2021   SpO2 95%   BMI 46.06 kg/m?  ?Gen:   Awake, no distress   ?Resp:  Normal effort  ?MSK:   Moves extremities without difficulty  ?Other:  Lungs clear bilaterally.  Abdomen soft nontender. ? ?Medical Decision Making  ?Medically screening exam initiated at 12:53 PM.  Appropriate orders placed.  Alejandra Clark was informed that the remainder of the evaluation will be completed by another provider, this initial triage assessment does not replace that evaluation, and the importance of remaining in the ED until their evaluation is complete. ? ? ?  ?Azucena Cecil, PA-C ?12/15/21 1254 ? ?

## 2021-12-15 NOTE — ED Notes (Addendum)
Not able to get vain for lab work. ?

## 2021-12-15 NOTE — ED Triage Notes (Signed)
Pt states she started having chest pain about 7 hours ago. States lying down make the pain better. Pt also reports left leg pain that started 5 days ago. Denies injury. ?

## 2023-01-27 ENCOUNTER — Encounter (HOSPITAL_COMMUNITY): Payer: Self-pay | Admitting: Emergency Medicine

## 2023-01-27 ENCOUNTER — Emergency Department (HOSPITAL_COMMUNITY): Payer: Medicaid Other

## 2023-01-27 ENCOUNTER — Other Ambulatory Visit: Payer: Self-pay

## 2023-01-27 ENCOUNTER — Emergency Department (HOSPITAL_COMMUNITY)
Admission: EM | Admit: 2023-01-27 | Discharge: 2023-01-27 | Disposition: A | Discharge status: Home / Self Care | Payer: Medicaid Other | Attending: Emergency Medicine | Admitting: Emergency Medicine

## 2023-01-27 DIAGNOSIS — I10 Essential (primary) hypertension: Secondary | ICD-10-CM | POA: Diagnosis not present

## 2023-01-27 DIAGNOSIS — R519 Headache, unspecified: Secondary | ICD-10-CM

## 2023-01-27 LAB — URINALYSIS, ROUTINE W REFLEX MICROSCOPIC
Bilirubin Urine: NEGATIVE
Glucose, UA: NEGATIVE mg/dL
Hgb urine dipstick: NEGATIVE
Ketones, ur: NEGATIVE mg/dL
Nitrite: NEGATIVE
Protein, ur: 30 mg/dL — AB
Specific Gravity, Urine: 1.019 (ref 1.005–1.030)
pH: 6 (ref 5.0–8.0)

## 2023-01-27 LAB — COMPREHENSIVE METABOLIC PANEL
ALT: 40 U/L (ref 0–44)
AST: 40 U/L (ref 15–41)
Albumin: 4.4 g/dL (ref 3.5–5.0)
Alkaline Phosphatase: 59 U/L (ref 38–126)
Anion gap: 10 (ref 5–15)
BUN: 8 mg/dL (ref 6–20)
CO2: 24 mmol/L (ref 22–32)
Calcium: 9.8 mg/dL (ref 8.9–10.3)
Chloride: 104 mmol/L (ref 98–111)
Creatinine, Ser: 0.64 mg/dL (ref 0.44–1.00)
GFR, Estimated: >60 mL/min (ref >60)
Glucose, Bld: 140 mg/dL — ABNORMAL HIGH (ref 70–99)
Potassium: 3.7 mmol/L (ref 3.5–5.1)
Sodium: 138 mmol/L (ref 135–145)
Total Bilirubin: 0.4 mg/dL (ref 0.3–1.2)
Total Protein: 8.5 g/dL — ABNORMAL HIGH (ref 6.5–8.1)

## 2023-01-27 LAB — CBC
HCT: 38.3 % (ref 36.0–46.0)
Hemoglobin: 12 g/dL (ref 12.0–15.0)
MCH: 25.8 pg — ABNORMAL LOW (ref 26.0–34.0)
MCHC: 31.3 g/dL (ref 30.0–36.0)
MCV: 82.4 fL (ref 80.0–100.0)
Platelets: 399 10*3/uL (ref 150–400)
RBC: 4.65 MIL/uL (ref 3.87–5.11)
RDW: 14.5 % (ref 11.5–15.5)
WBC: 8.5 10*3/uL (ref 4.0–10.5)
nRBC: 0 % (ref 0.0–0.2)

## 2023-01-27 LAB — I-STAT BETA HCG BLOOD, ED (MC, WL, AP ONLY): I-stat hCG, quantitative: <5 m[IU]/mL (ref <5)

## 2023-01-27 LAB — URINALYSIS, MICROSCOPIC (REFLEX)

## 2023-01-27 LAB — CBG MONITORING, ED: Glucose-Capillary: 145 mg/dL — ABNORMAL HIGH (ref 70–99)

## 2023-01-27 LAB — LIPASE, BLOOD: Lipase: 35 U/L (ref 11–51)

## 2023-01-27 MED ORDER — DIPHENHYDRAMINE HCL 50 MG/ML IJ SOLN
25.0000 mg | Freq: Once | INTRAMUSCULAR | Status: AC
  Administered 2023-01-27: 25 mg via INTRAMUSCULAR

## 2023-01-27 MED ORDER — PROCHLORPERAZINE EDISYLATE 10 MG/2ML IJ SOLN
10.0000 mg | Freq: Once | INTRAMUSCULAR | Status: AC
  Administered 2023-01-27: 10 mg via INTRAMUSCULAR

## 2023-01-27 MED ORDER — DEXAMETHASONE 4 MG PO TABS
10.0000 mg | ORAL_TABLET | Freq: Once | ORAL | Status: AC
  Administered 2023-01-27: 10 mg via ORAL
  Filled 2023-01-27: qty 1

## 2023-01-27 MED ORDER — PROCHLORPERAZINE EDISYLATE 10 MG/2ML IJ SOLN
INTRAMUSCULAR | Status: AC
  Filled 2023-01-27: qty 2

## 2023-01-27 MED ORDER — DIPHENHYDRAMINE HCL 50 MG/ML IJ SOLN
INTRAMUSCULAR | Status: AC
  Filled 2023-01-27: qty 1

## 2023-01-27 NOTE — ED Provider Notes (Signed)
[place] [facility]PARTMENT AT [facility] Provider Note   CSN: [id number] Arrival date & time: 01/27/23  1905     History  Chief Complaint  Patient presents with   Hypertension   Nausea   Emesis    Alejandra Clark is a 32 y.o. female.  32 yo F with a chief complaints of headache nausea vomiting.  Going on since yesterday afternoon.  She describes the headache as quite mild.  May be a 2 or 3 at most.  She denies one-sided numbness or weakness denies difficulty speech or swallowing.  1 episode of emesis and a couple loose stools.  No known sick contacts.  No recent travel.  She has struggled with recurrent sinus congestion.  Has seen an ear nose and throat doctor within the week and was started on antibiotics.  She thinks her sinuses are doing a little bit better.  She is also very concerned about her blood pressure.  Has been much higher than normal.  Denies a history of high blood pressure.  As high as 214/120 at home.   Hypertension  Emesis      Home Medications Prior to Admission medications   Medication Sig Start Date End Date Taking? Authorizing Provider  acetaminophen (TYLENOL) 500 MG tablet Take 2 tablets (1,000 mg total) every 6 (six) hours as needed by mouth. 06/27/17   Arby Barrette, MD  Cetirizine HCl 10 MG CAPS Take 1 capsule (10 mg total) by mouth daily for 10 days. 11/28/20 12/08/20  Wieters, Hallie C, PA-C  ferrous sulfate 325 (65 FE) MG tablet Take 1 tablet (325 mg total) by mouth daily. 08/27/17   Petrucelli, Tanay R, PA-C  fluticasone (FLONASE) 50 MCG/ACT nasal spray Place 1-2 sprays into both nostrils daily. 11/28/20   Wieters, Hallie C, PA-C  meclizine (ANTIVERT) 25 MG tablet Take 1 tablet (25 mg total) by mouth 3 (three) times daily as needed for dizziness. 08/17/20   Wieters, Hallie C, PA-C  metFORMIN (GLUCOPHAGE) 500 MG tablet Take by mouth 2 (two) times daily with a meal.    [provider]  topiramate (TOPAMAX) 50 MG tablet Take 50  mg by mouth 2 (two) times daily.    [provider]      Allergies    Penicillins and Tramadol    Review of Systems   Review of Systems  Gastrointestinal:  Positive for vomiting.    Physical Exam Updated Vital Signs BP (!) 129/103   Pulse 74   Temp 98.3 F (36.8 C) (Oral)   Resp 18   SpO2 100%  Physical Exam Vitals and nursing note reviewed.  Constitutional:      General: She is not in acute distress.    Appearance: She is well-developed. She is not diaphoretic.  HENT:     Head: Normocephalic and atraumatic.  Eyes:     Pupils: Pupils are equal, round, and reactive to light.  Cardiovascular:     Rate and Rhythm: Normal rate and regular rhythm.     Heart sounds: No murmur heard.    No friction rub. No gallop.  Pulmonary:     Effort: Pulmonary effort is normal.     Breath sounds: No wheezing or rales.  Abdominal:     General: There is no distension.     Palpations: Abdomen is soft.     Tenderness: There is no abdominal tenderness.  Musculoskeletal:        General: No tenderness.     Cervical back: Normal  range of motion and neck supple.  Skin:    General: Skin is warm and dry.  Neurological:     Mental Status: She is alert and oriented to person, place, and time.     GCS: GCS eye subscore is 4. GCS verbal subscore is 5. GCS motor subscore is 6.     Cranial Nerves: Cranial nerves 2-12 are intact.     Sensory: Sensation is intact.     Motor: Motor function is intact.     Coordination: Coordination is intact.     Gait: Gait is intact.     Comments: Benign neurologic exam.  Psychiatric:        Behavior: Behavior normal.     ED Results / Procedures / Treatments   Labs (all labs ordered are listed, but only abnormal results are displayed) Labs Reviewed  COMPREHENSIVE METABOLIC PANEL - Abnormal; Notable for the following components:      Result Value   Glucose, Bld 140 (*)    Total Protein 8.5 (*)    All other components within normal limits  CBC -  Abnormal; Notable for the following components:   MCH 25.8 (*)    All other components within normal limits  URINALYSIS, ROUTINE W REFLEX MICROSCOPIC - Abnormal; Notable for the following components:   APPearance CLOUDY (*)    Protein, ur 30 (*)    Leukocytes,Ua SMALL (*)    All other components within normal limits  URINALYSIS, MICROSCOPIC (REFLEX) - Abnormal; Notable for the following components:   Bacteria, UA FEW (*)    All other components within normal limits  CBG MONITORING, ED - Abnormal; Notable for the following components:   Glucose-Capillary 145 (*)    All other components within normal limits  LIPASE, BLOOD  I-STAT BETA HCG BLOOD, ED ([facility], [facility], [facility] ONLY)    EKG EKG Interpretation  Date/Time:  Tuesday January 27 2023 19:31:17 EDT Ventricular Rate:  74 PR Interval:  152 QRS Duration: 80 QT Interval:  370 QTC Calculation: 411 R Axis:   60 Text Interpretation: Sinus rhythm Low voltage, precordial leads No significant change since last tracing Confirmed by Melene Plan [phone]) on 01/27/2023 9:04:03 PM  Radiology CT Head Wo Contrast  Result Date: 01/27/2023 CLINICAL DATA:  New onset headaches for several hours, initial encounter EXAM: CT HEAD WITHOUT CONTRAST TECHNIQUE: Contiguous axial images were obtained from the base of the skull through the vertex without intravenous contrast. RADIATION DOSE REDUCTION: This exam was performed according to the departmental dose-optimization program which includes automated exposure control, adjustment of the mA and/or kV according to patient size and/or use of iterative reconstruction technique. COMPARISON:  None Available. FINDINGS: Brain: No evidence of acute infarction, hemorrhage, hydrocephalus, extra-axial collection or mass lesion/mass effect. Vascular: No hyperdense vessel or unexpected calcification. Skull: Normal. Negative for fracture or focal lesion. Sinuses/Orbits: No acute finding. Other: None. IMPRESSION: No acute intracranial  abnormality noted. Electronically Signed   By: Alcide Clever M.D.   On: 01/27/2023 22:04    Procedures Procedures    Medications Ordered in ED Medications  diphenhydrAMINE (BENADRYL) 50 MG/ML injection (  Canceled Entry 01/27/23 2232)  dexamethasone (DECADRON) tablet 10 mg (has no administration in time range)  prochlorperazine (COMPAZINE) injection 10 mg ( Intramuscular Canceled Entry 01/27/23 2232)  diphenhydrAMINE (BENADRYL) injection 25 mg (25 mg Intramuscular Given 01/27/23 2204)    ED Course/ Medical Decision Making/ A&P  Medical Decision Making Amount and/or Complexity of Data Reviewed Labs: ordered. Radiology: ordered.  Risk Prescription drug management.   32 yo F with a chief complaints of headache nausea vomiting going on since last night.  She is well-appearing nontoxic.  Has a benign neurologic exam.  I reviewed the patient's medical records and she has a couple mildly elevated blood pressure readings in prior ED visits but mostly are read as normal.  Obtained a CT scan of the head that shows no obvious intracranial pathology.  No significant electrolyte abnormalities.  LFTs and lipase are unremarkable.  Patient is more concerned about the blood pressure than anything else.  I did offer to start her on amlodipine but after discussing common side effects and possible risk she would prefer to hold off and discuss it with her family doctor.  10:45 PM:  I have discussed the diagnosis/risks/treatment options with the patient.  Evaluation and diagnostic testing in the emergency department does not suggest an emergent condition requiring admission or immediate intervention beyond what has been performed at this time.  They will follow up with PCP. We also discussed returning to the ED immediately if new or worsening sx occur. We discussed the sx which are most concerning (e.g., sudden worsening pain, fever, inability to tolerate by mouth) that necessitate  immediate return. Medications administered to the patient during their visit and any new prescriptions provided to the patient are listed below.  Medications given during this visit Medications  diphenhydrAMINE (BENADRYL) 50 MG/ML injection (  Canceled Entry 01/27/23 2232)  dexamethasone (DECADRON) tablet 10 mg (has no administration in time range)  prochlorperazine (COMPAZINE) injection 10 mg ( Intramuscular Canceled Entry 01/27/23 2232)  diphenhydrAMINE (BENADRYL) injection 25 mg (25 mg Intramuscular Given 01/27/23 2204)     The patient appears reasonably screen and/or stabilized for discharge and I doubt any other medical condition or other [facility] requiring further screening, evaluation, or treatment in the ED at this time prior to discharge.          Final Clinical Impression(s) / ED Diagnoses Final diagnoses:  Acute nonintractable headache, unspecified headache type  Uncontrolled hypertension    Rx / DC Orders ED Discharge Orders     None         Melene Plan, DO 01/27/23 2245

## 2023-01-27 NOTE — ED Triage Notes (Signed)
Patient presents due to hypertension, blurry vision, nausea and vomiting since about 0745 this am. Highest BP reading prior to arrival was 214/126 using a home cuff.

## 2023-01-27 NOTE — Discharge Instructions (Signed)
Please follow-up with your family doctor in the office.  Please return for worsening headache one-sided numbness or weakness difficulty speech or swallowing.
